# Patient Record
Sex: Female | Born: 1960 | Race: White | Hispanic: No | Marital: Married | Smoking: Current every day smoker
Health system: Southern US, Community
[De-identification: ages and names within clinical notes are randomized; demographics above are authoritative.]

## PROBLEM LIST (undated history)

## (undated) DIAGNOSIS — I2510 Atherosclerotic heart disease of native coronary artery without angina pectoris: Secondary | ICD-10-CM

## (undated) DIAGNOSIS — I10 Essential (primary) hypertension: Secondary | ICD-10-CM

## (undated) DIAGNOSIS — E785 Hyperlipidemia, unspecified: Secondary | ICD-10-CM

## (undated) DIAGNOSIS — F419 Anxiety disorder, unspecified: Secondary | ICD-10-CM

## (undated) DIAGNOSIS — M1990 Unspecified osteoarthritis, unspecified site: Secondary | ICD-10-CM

## (undated) DIAGNOSIS — R51 Headache: Secondary | ICD-10-CM

## (undated) DIAGNOSIS — F319 Bipolar disorder, unspecified: Secondary | ICD-10-CM

## (undated) DIAGNOSIS — K219 Gastro-esophageal reflux disease without esophagitis: Secondary | ICD-10-CM

## (undated) DIAGNOSIS — F329 Major depressive disorder, single episode, unspecified: Secondary | ICD-10-CM

## (undated) DIAGNOSIS — Z720 Tobacco use: Secondary | ICD-10-CM

## (undated) DIAGNOSIS — R519 Headache, unspecified: Secondary | ICD-10-CM

## (undated) DIAGNOSIS — F32A Depression, unspecified: Secondary | ICD-10-CM

## (undated) HISTORY — DX: Hyperlipidemia, unspecified: E78.5

---

## 1989-06-18 HISTORY — PX: LAPAROSCOPIC CHOLECYSTECTOMY: SUR755

## 2015-01-04 ENCOUNTER — Encounter (HOSPITAL_COMMUNITY): Payer: Self-pay | Admitting: Cardiology

## 2015-01-04 ENCOUNTER — Encounter (HOSPITAL_COMMUNITY)
Admission: EM | Disposition: A | Discharge status: Home / Self Care | Payer: 59 | Source: Home / Self Care | Attending: Cardiovascular Disease

## 2015-01-04 ENCOUNTER — Inpatient Hospital Stay (HOSPITAL_COMMUNITY)
Admission: EM | Admit: 2015-01-04 | Discharge: 2015-01-08 | DRG: 246 | Disposition: A | Discharge status: Home / Self Care | Payer: 59 | Attending: Cardiovascular Disease | Admitting: Cardiovascular Disease

## 2015-01-04 ENCOUNTER — Emergency Department (HOSPITAL_COMMUNITY)
Admission: EM | Admit: 2015-01-04 | Discharge: 2015-01-04 | Disposition: A | Discharge status: Home / Self Care | Payer: 59

## 2015-01-04 ENCOUNTER — Ambulatory Visit (HOSPITAL_COMMUNITY): Admission: EM | Admit: 2015-01-04 | Payer: Self-pay

## 2015-01-04 DIAGNOSIS — E119 Type 2 diabetes mellitus without complications: Secondary | ICD-10-CM | POA: Diagnosis present

## 2015-01-04 DIAGNOSIS — Z720 Tobacco use: Secondary | ICD-10-CM | POA: Diagnosis not present

## 2015-01-04 DIAGNOSIS — R51 Headache: Secondary | ICD-10-CM | POA: Diagnosis not present

## 2015-01-04 DIAGNOSIS — Z880 Allergy status to penicillin: Secondary | ICD-10-CM | POA: Diagnosis not present

## 2015-01-04 DIAGNOSIS — I2542 Coronary artery dissection: Secondary | ICD-10-CM | POA: Diagnosis not present

## 2015-01-04 DIAGNOSIS — E785 Hyperlipidemia, unspecified: Secondary | ICD-10-CM | POA: Diagnosis present

## 2015-01-04 DIAGNOSIS — I10 Essential (primary) hypertension: Secondary | ICD-10-CM | POA: Diagnosis present

## 2015-01-04 DIAGNOSIS — I213 ST elevation (STEMI) myocardial infarction of unspecified site: Secondary | ICD-10-CM | POA: Diagnosis not present

## 2015-01-04 DIAGNOSIS — E876 Hypokalemia: Secondary | ICD-10-CM | POA: Diagnosis present

## 2015-01-04 DIAGNOSIS — R7301 Impaired fasting glucose: Secondary | ICD-10-CM | POA: Diagnosis present

## 2015-01-04 DIAGNOSIS — F17200 Nicotine dependence, unspecified, uncomplicated: Secondary | ICD-10-CM | POA: Diagnosis present

## 2015-01-04 DIAGNOSIS — I2119 ST elevation (STEMI) myocardial infarction involving other coronary artery of inferior wall: Secondary | ICD-10-CM | POA: Diagnosis present

## 2015-01-04 DIAGNOSIS — I2111 ST elevation (STEMI) myocardial infarction involving right coronary artery: Secondary | ICD-10-CM

## 2015-01-04 DIAGNOSIS — I2510 Atherosclerotic heart disease of native coronary artery without angina pectoris: Secondary | ICD-10-CM

## 2015-01-04 HISTORY — PX: LEFT HEART CATHETERIZATION WITH CORONARY ANGIOGRAM: SHX5451

## 2015-01-04 HISTORY — DX: Essential (primary) hypertension: I10

## 2015-01-04 HISTORY — PX: PERCUTANEOUS CORONARY STENT INTERVENTION (PCI-S): SHX5485

## 2015-01-04 HISTORY — DX: Atherosclerotic heart disease of native coronary artery without angina pectoris: I25.10

## 2015-01-04 HISTORY — DX: Bipolar disorder, unspecified: F31.9

## 2015-01-04 HISTORY — DX: Headache: R51

## 2015-01-04 HISTORY — DX: Major depressive disorder, single episode, unspecified: F32.9

## 2015-01-04 HISTORY — DX: Gastro-esophageal reflux disease without esophagitis: K21.9

## 2015-01-04 HISTORY — DX: Anxiety disorder, unspecified: F41.9

## 2015-01-04 HISTORY — DX: Depression, unspecified: F32.A

## 2015-01-04 HISTORY — DX: Headache, unspecified: R51.9

## 2015-01-04 HISTORY — DX: Tobacco use: Z72.0

## 2015-01-04 LAB — MRSA PCR SCREENING: MRSA BY PCR: NEGATIVE

## 2015-01-04 LAB — LIPID PANEL
CHOLESTEROL: 236 mg/dL — AB (ref 0–200)
HDL: 35 mg/dL — AB (ref >39)
LDL CALC: 170 mg/dL — AB (ref 0–99)
Total CHOL/HDL Ratio: 6.7 RATIO
Triglycerides: 154 mg/dL — ABNORMAL HIGH (ref <150)
VLDL: 31 mg/dL (ref 0–40)

## 2015-01-04 LAB — CBC
HCT: 39.5 % (ref 36.0–46.0)
HCT: 35.7 % — ABNORMAL LOW (ref 36.0–46.0)
HEMOGLOBIN: 13.3 g/dL (ref 12.0–15.0)
HEMOGLOBIN: 11.9 g/dL — AB (ref 12.0–15.0)
MCH: 31.2 pg (ref 26.0–34.0)
MCH: 30.9 pg (ref 26.0–34.0)
MCHC: 33.7 g/dL (ref 30.0–36.0)
MCHC: 33.3 g/dL (ref 30.0–36.0)
MCV: 92.7 fL (ref 78.0–100.0)
MCV: 92.7 fL (ref 78.0–100.0)
Platelets: 368 10*3/uL (ref 150–400)
Platelets: 266 10*3/uL (ref 150–400)
RBC: 4.26 MIL/uL (ref 3.87–5.11)
RBC: 3.85 MIL/uL — AB (ref 3.87–5.11)
RDW: 13.2 % (ref 11.5–15.5)
RDW: 13.3 % (ref 11.5–15.5)
WBC: 13.9 10*3/uL — AB (ref 4.0–10.5)
WBC: 10.2 10*3/uL (ref 4.0–10.5)

## 2015-01-04 LAB — POCT ACTIVATED CLOTTING TIME: Activated Clotting Time: 128 seconds

## 2015-01-04 LAB — BASIC METABOLIC PANEL
ANION GAP: 9 (ref 5–15)
BUN: 11 mg/dL (ref 6–23)
CALCIUM: 8.5 mg/dL (ref 8.4–10.5)
CHLORIDE: 112 mmol/L (ref 96–112)
CO2: 19 mmol/L (ref 19–32)
CREATININE: 0.73 mg/dL (ref 0.50–1.10)
GFR calc Af Amer: >90 mL/min (ref >90)
Glucose, Bld: 171 mg/dL — ABNORMAL HIGH (ref 70–99)
POTASSIUM: 3.3 mmol/L — AB (ref 3.5–5.1)
SODIUM: 140 mmol/L (ref 135–145)

## 2015-01-04 LAB — TROPONIN I
Troponin I: 49.97 ng/mL (ref <0.031)
Troponin I: 59.92 ng/mL (ref <0.031)
Troponin I: 36.96 ng/mL (ref <0.031)

## 2015-01-04 SURGERY — LEFT HEART CATHETERIZATION WITH CORONARY ANGIOGRAM
Anesthesia: LOCAL

## 2015-01-04 MED ORDER — ATORVASTATIN CALCIUM 80 MG PO TABS
80.0000 mg | ORAL_TABLET | Freq: Every day | ORAL | Status: DC
  Administered 2015-01-04 – 2015-01-07 (×4): 80 mg via ORAL
  Filled 2015-01-04 (×5): qty 1

## 2015-01-04 MED ORDER — SODIUM CHLORIDE 0.9 % IV SOLN
1.0000 mg/kg/h | INTRAVENOUS | Status: DC
  Filled 2015-01-04: qty 250

## 2015-01-04 MED ORDER — MORPHINE SULFATE 2 MG/ML IJ SOLN
2.0000 mg | INTRAMUSCULAR | Status: DC | PRN
  Administered 2015-01-04 (×2): 2 mg via INTRAVENOUS
  Filled 2015-01-04 (×3): qty 1

## 2015-01-04 MED ORDER — NITROGLYCERIN 1 MG/10 ML FOR IR/CATH LAB
INTRA_ARTERIAL | Status: AC
  Filled 2015-01-04: qty 10

## 2015-01-04 MED ORDER — TIROFIBAN HCL IV 5 MG/100ML
0.1500 ug/kg/min | INTRAVENOUS | Status: AC
  Administered 2015-01-04 (×2): 0.15 ug/kg/min via INTRAVENOUS
  Filled 2015-01-04 (×4): qty 100

## 2015-01-04 MED ORDER — NITROGLYCERIN IN D5W 200-5 MCG/ML-% IV SOLN
0.0000 ug/min | INTRAVENOUS | Status: DC
  Administered 2015-01-04: 10 ug/min via INTRAVENOUS

## 2015-01-04 MED ORDER — FENTANYL CITRATE 0.05 MG/ML IJ SOLN
INTRAMUSCULAR | Status: AC
  Filled 2015-01-04: qty 2

## 2015-01-04 MED ORDER — TICAGRELOR 90 MG PO TABS
ORAL_TABLET | ORAL | Status: AC
  Filled 2015-01-04: qty 2

## 2015-01-04 MED ORDER — SODIUM CHLORIDE 0.9 % IV SOLN
INTRAVENOUS | Status: AC
  Administered 2015-01-04: 75 mL/h via INTRAVENOUS

## 2015-01-04 MED ORDER — LIDOCAINE HCL (PF) 1 % IJ SOLN
INTRAMUSCULAR | Status: AC
  Filled 2015-01-04: qty 30

## 2015-01-04 MED ORDER — BIVALIRUDIN 250 MG IV SOLR
INTRAVENOUS | Status: AC
  Filled 2015-01-04: qty 250

## 2015-01-04 MED ORDER — ASPIRIN 81 MG PO CHEW
81.0000 mg | CHEWABLE_TABLET | Freq: Every day | ORAL | Status: DC
  Administered 2015-01-05 – 2015-01-08 (×3): 81 mg via ORAL
  Filled 2015-01-04 (×4): qty 1

## 2015-01-04 MED ORDER — TIROFIBAN HCL IV 5 MG/100ML
INTRAVENOUS | Status: AC
  Filled 2015-01-04: qty 100

## 2015-01-04 MED ORDER — TICAGRELOR 90 MG PO TABS
90.0000 mg | ORAL_TABLET | Freq: Two times a day (BID) | ORAL | Status: DC
  Administered 2015-01-04 – 2015-01-08 (×8): 90 mg via ORAL
  Filled 2015-01-04 (×9): qty 1

## 2015-01-04 MED ORDER — LORAZEPAM 1 MG PO TABS
1.0000 mg | ORAL_TABLET | Freq: Three times a day (TID) | ORAL | Status: DC | PRN
  Administered 2015-01-04 – 2015-01-06 (×5): 1 mg via ORAL
  Filled 2015-01-04 (×6): qty 1

## 2015-01-04 MED ORDER — HEPARIN (PORCINE) IN NACL 2-0.9 UNIT/ML-% IJ SOLN
INTRAMUSCULAR | Status: AC
  Filled 2015-01-04: qty 1500

## 2015-01-04 MED ORDER — POTASSIUM CHLORIDE CRYS ER 20 MEQ PO TBCR
40.0000 meq | EXTENDED_RELEASE_TABLET | Freq: Once | ORAL | Status: AC
Start: 2015-01-04 — End: 2015-01-04
  Administered 2015-01-04: 40 meq via ORAL
  Filled 2015-01-04 (×2): qty 2

## 2015-01-04 MED ORDER — METOPROLOL TARTRATE 12.5 MG HALF TABLET
12.5000 mg | ORAL_TABLET | Freq: Two times a day (BID) | ORAL | Status: DC
  Administered 2015-01-04 – 2015-01-08 (×9): 12.5 mg via ORAL
  Filled 2015-01-04 (×10): qty 1

## 2015-01-04 MED ORDER — ONDANSETRON HCL 4 MG/2ML IJ SOLN: 4.0000 mg | Freq: Four times a day (QID) | INTRAMUSCULAR | Status: DC | PRN

## 2015-01-04 MED ORDER — ACETAMINOPHEN 325 MG PO TABS
650.0000 mg | ORAL_TABLET | ORAL | Status: DC | PRN
  Administered 2015-01-04 – 2015-01-08 (×7): 650 mg via ORAL
  Filled 2015-01-04 (×7): qty 2

## 2015-01-04 MED ORDER — HEPARIN (PORCINE) IN NACL 2-0.9 UNIT/ML-% IJ SOLN
INTRAMUSCULAR | Status: AC
  Filled 2015-01-04: qty 1000

## 2015-01-04 MED ORDER — ATROPINE SULFATE 0.1 MG/ML IJ SOLN
INTRAMUSCULAR | Status: AC
  Filled 2015-01-04: qty 10

## 2015-01-04 NOTE — ED Notes (Signed)
To Cath lab-- with monitor, pacer pads on,

## 2015-01-04 NOTE — CV Procedure (Addendum)
Sara Trujillo is a 54 y.o. female    [id number] LOCATION:  FACILITY: [facility]  PHYSICIAN: Quay Burow, M.D. [birth year]   DATE OF PROCEDURE:  01/04/2015  DATE OF DISCHARGE:     CARDIAC CATHETERIZATION / PCI NOTE      History obtained from chart review.Sara Trujillo is a 54 year old female appearing married Caucasian female with no children who presented to [facility] emergency room with an inferior STEMI. The pain began at 6:00 this morning. Her only risk factors are tobacco abuse. She was hemodynamic stable and was brought to the Cath Lab for urgent intervention.   PROCEDURE DESCRIPTION:   The patient was brought to the second floor  [facility] lab in the postabsorptive state. She was   premedicated with morphine IV in the emergency room.Marland Kitchen Her right groin was prepped and shaved in usual sterile fashion. Xylocaine 1% was used  for local anesthesia. A 6 French sheath was inserted into the right common femoral  artery using standard Seldinger technique. 6 French right and left Judkins diagnostic catheters along with a 6 French pigtail catheter were used for selective coronary angiography and left ventriculography respectively. The pigtail was used for the entirety of the case. Retrograde aortic, left ventricular and pullback pressures were recorded.   HEMODYNAMICS:    AO SYSTOLIC/AO DIASTOLIC: 009/23   LV SYSTOLIC/LV DIASTOLIC: 300/76  ANGIOGRAPHIC RESULTS:   1. Left main; normal  2. LAD; 60% proximal, first diagonal branch was small Caliber and moderate in length and had 50-60% stenosis in its proximal third 3. Left circumflex; nondominant with a 90% mid AV groove, there was a ramus branch that had an 80% segmental mid stenosis.  4. Right coronary artery; dominant with occlusion at the Genu . This was the infarct related artery 5. Left ventriculography; RAO left ventriculogram was performed using  25 mL of Visipaque dye at 12 mL/second. The overall LVEF  estimated  60 %  With wall motion abnormalities notable for a true apical severe hypokinesia  IMPRESSION:Sara Trujillo has an occluded RCA responsible for her inferior STEMI with high-grade disease in her circumflex and moderate disease in her ramus intermedius and LAD/diagonal branch. We'll proceed with PCI and stenting of her RCA  Procedure description: The patient received Brilenta 180 mg by mouth addition to angina bolus and without infusion. Her ACT was 417. Total contrast administered and the case was 135 mL. A 6 [place] guide catheter along with a 0.14/190 cm [place] guidewire and that 2 mm x 12 mm long balloon angioplasty was performed. There was moderate disease in the proximal right, high-grade disease it at the Genu and crux of the vessel. B segments were dilated with a balloon. Following this a 2.25 x 12 mm long science drug-eluting stent was then carefully positioned angiographically and deployed in the distal RCA just before the takeoff of the PDA as well as at the Genu. There were post dilated with 2.5 mm millimeter by 8 mm long noncompliant balloons up to 16 atm (2.6) resulting reduction total occlusion to 0% residual with TIMI-3 flow. The patient was stable at the end of the procedure however while still on the Cath Lab table she developed recurrent chest pain and EKG showed ST segment elevation in the inferior leads and therefore it was decided to repeat angiography to determine patency of the RCA.  Overall impression: Successful RCA PCI and stenting of distal RCA with 2 drug-eluting stents with residual disease in the nondominant circumflex, LAD diagonal branch and  ramus branch with preserved LV function. The door to balloon time was 27 minutes.  Lorretta Harp MD, [facility] 01/04/2015 8:25 AM    Addendum: At the end of the procedure the patient developed recurrent chest pain with recurrent ST segment elevation in the inferior leads. Based on this I decided to relook at the RCA.  I exchanged the sheath out using double glove technique. The RCA demonstrated occlusion in the proximal portion. The ACT was 325. I elected to start Aggrastat which we will continue for 18 hours. I then reengaged the RCA with a JR4 guide catheter and was able to cross the lesion with a pro-water guidewire and balloon the proximal RCA with a 2 mm x 12 mm E[place]hora  Balloon. Following this I stented the entire mid and proximal segment with a 2.5 mm x 28 mm long Xience drug-eluting stent at 15 atm and postdilated with a 2.75 mm x 20 mm long [place] Euphora balloon at 16 atm (2.8 mm)  resulting reduction of a total occlusion to 0% residual with TIMI-3 flow. At the end of the procedure the patient's pain markedly improved. The plan will be to continue full dose Angiomax for 4 hours and Aggrastat for 18 hours.   Lorretta Harp, M.D., [place], [facility], [place] [address] [place], [place]  [address]  [phone] 01/04/2015 9:12 AM

## 2015-01-04 NOTE — Progress Notes (Signed)
Elevated troponin level called to PA B.S.

## 2015-01-04 NOTE — ED Notes (Signed)
Pt undressed and in a gown on the monitor and on the zole

## 2015-01-04 NOTE — Progress Notes (Signed)
Chaplain responded to Code STEMI.  Husband indicates that patient had been taking care of him (he has had "uncurable colon cancer" for the last two years; trying to get into a clinical trial).  Twin sister of patient and brother in law had just arrived.  All seem anxious, especially twin sister.  Provided drinks, emotional support and orientation to area and standards of procedure.  Please call if further support is needed.  Debby Budalacios, Pricilla Moehle DolandN, IowaChaplain [phone]

## 2015-01-04 NOTE — H&P (Signed)
Patient ID: Alianys Chacko MRN: [id number], DOB/AGE: 1961-05-01   Admit date: 01/04/2015   Primary Physician: No primary care provider on file. Primary Cardiologist: Dr. Allyson Sabal ([place])  Pt. Profile:  54 y/o female smoker with no prior cardiac history presenting w/ inferior STEMI  Problem List  Past Medical History  Diagnosis Date  . CAD (coronary artery disease) 01/04/15    Inferior STEMI  . Tobacco abuse     No past surgical history on file.   Allergies  Allergies  Allergen Reactions  . Penicillins     HPI The patient is a 54 y/o female, with a history of tobacco abuse but no other PMH presenting with inferior STEMI. She is not followed routinely by a PCP. She denies any prior cardiac history.  She is currently on no medications. She has a penicillin allergy.   Around 6 AM today, she developed severe SSCP and malaise. No reported dyspnea, nausea or vomiting. EMS was called. EKG in the field showed inferior ST segment elevataions. She was transported urgently to [facility] cath lab for urgent coronary intervention. Enroute, she was given 325 mg of ASA. On arrival, she was with ongoing 7/10 chest pain.    Home Medications  Prior to Admission medications   Not on File    Family History  Family History  Problem Relation Age of Onset  . Family history unknown: Yes    Social History  History   Social History  . Marital Status: Married    Spouse Name: N/A  . Number of Children: N/A  . Years of Education: N/A   Occupational History  . Not on file.   Social History Main Topics  . Smoking status: Current Every Day Smoker  . Smokeless tobacco: Not on file  . Alcohol Use: Not on file  . Drug Use: Not on file  . Sexual Activity: Not on file   Other Topics Concern  . Not on file   Social History Narrative  . No narrative on file     Review of Systems General:  No chills, fever, night sweats or weight changes.  Cardiovascular:  + for chest pain, no dyspnea  on exertion, edema, orthopnea, palpitations, paroxysmal nocturnal dyspnea. Dermatological: No rash, lesions/masses Respiratory: No cough, dyspnea Urologic: No hematuria, dysuria Abdominal:   No nausea, vomiting, diarrhea, bright red blood per rectum, melena, or hematemesis Neurologic:  No visual changes, wkns, changes in mental status. All other systems reviewed and are otherwise negative except as noted above.  Physical Exam  There were no vitals taken for this visit.  General: Pleasant, NAD Psych: Normal affect. Neuro: Alert and oriented X 3. Moves all extremities spontaneously. HEENT: Normal  Neck: Supple without bruits or JVD. Lungs:  Resp regular and unlabored, CTA. Heart: RRR no s3, s4, or murmurs. Abdomen: Soft, non-tender, non-distended, BS + x 4.  Extremities: No clubbing, cyanosis or edema. DP/PT/Radials 2+ and equal bilaterally.  Labs  Troponin (Point of Care Test) No results for input(s): TROPIPOC in the last 72 hours. No results for input(s): CKTOTAL, CKMB, TROPONINI in the last 72 hours. No results found for: WBC, HGB, HCT, MCV, PLT No results for input(s): NA, K, CL, CO2, BUN, CREATININE, CALCIUM, PROT, BILITOT, ALKPHOS, ALT, AST, GLUCOSE in the last 168 hours.  Invalid input(s): LABALBU No results found for: CHOL, HDL, LDLCALC, TRIG No results found for: DDIMER   Radiology/Studies  No results found.  ECG  Inferior ST elevations. HR 66 bpm   ASSESSMENT AND  PLAN  Active Problems:   Inferior STEMI (ST elevation myocardial infarction)   Tobacco abuse   ST elevation myocardial infarction (STEMI) of inferior wall   1. Inferior STEMI: secondary to occlusion of the distal RCA, successfully treated with PCI utilizing  DES x 2. Also with significant LCx disease. Will likely need PCI to circumflex prior to discharge. LVF normal. Treat with DAPT, high dose statin therapy, and BB and ACE-I as HR and BP allows.   2. Secondary Prevention:   - ASA, BB, ACE + Statin.    - Risk factor modification: encourage smoking cessation.   - Risk factor screening: check Lipid panel (goal LDL <70 mg/dL) and Hgb Y7W,GNFAOZHA1c,monitor BP  - Cardiac Rehab   Signed, Robbie LisSIMMONS, Mayci Haning, PA-C 01/04/2015, 8:10 AM

## 2015-01-04 NOTE — Progress Notes (Signed)
CRITICAL VALUE ALERT  Critical value received:  troponin  Date of notification:  01/04/2015  Time of notification:  2205  Critical value read back:yes  Nurse who received alert: Shary DecampLatroya Ellanore Vanhook Rn  MD notified (1st page):  Elink made aware  Time of first page:  2207  MD notified (2nd page):  Time of second page:  Responding MD:  Pola CornELink  Time MD responded:  2207

## 2015-01-04 NOTE — Progress Notes (Signed)
Patient still has right femoral sheath intact, no ambulation done.  Given MI education booklet, explained process of MI and risk factors.  She is a smoker, has been under stress (her husband has colon cancer), and she is sedentary.  Will continue to work with patient when sheath is removed and continue education.   [id number]

## 2015-01-04 NOTE — Progress Notes (Signed)
Right femoral 6 fr sheath pulled, manual  pressure held for , small area of bruising around site. Positive distal pulses +2, + cap refil, pt tolerated well.

## 2015-01-04 NOTE — Progress Notes (Signed)
Ambulated pt. Vitals signs remained WDL. With exception of HR. Went tachy for about 5 seconds. No c/o chest pain. No site complications.

## 2015-01-04 NOTE — Progress Notes (Signed)
Pt c/o chest pressure,  Not like pain from stemi. PA aware, 12 lead done, ntg gtt started, morphine given.

## 2015-01-04 NOTE — ED Notes (Signed)
To ED via [facility] with STEMI-- to go to cath lab-- dr berry at bedside on arrival, pt having pain 5/10-- midsternal, pressure. Received NTG 3 sl, ASA 324mg , Morphine 4 mg IV enroute.

## 2015-01-05 DIAGNOSIS — I10 Essential (primary) hypertension: Secondary | ICD-10-CM | POA: Diagnosis present

## 2015-01-05 DIAGNOSIS — Z720 Tobacco use: Secondary | ICD-10-CM

## 2015-01-05 DIAGNOSIS — E119 Type 2 diabetes mellitus without complications: Secondary | ICD-10-CM

## 2015-01-05 DIAGNOSIS — E785 Hyperlipidemia, unspecified: Secondary | ICD-10-CM

## 2015-01-05 DIAGNOSIS — I2119 ST elevation (STEMI) myocardial infarction involving other coronary artery of inferior wall: Secondary | ICD-10-CM

## 2015-01-05 LAB — BASIC METABOLIC PANEL
ANION GAP: 4 — AB (ref 5–15)
Anion gap: 6 (ref 5–15)
BUN: 8 mg/dL (ref 6–23)
BUN: 9 mg/dL (ref 6–23)
CALCIUM: 8.5 mg/dL (ref 8.4–10.5)
CALCIUM: 8.8 mg/dL (ref 8.4–10.5)
CHLORIDE: 109 mmol/L (ref 96–112)
CO2: 23 mmol/L (ref 19–32)
CO2: 27 mmol/L (ref 19–32)
CREATININE: 0.9 mg/dL (ref 0.50–1.10)
Chloride: 108 mmol/L (ref 96–112)
Creatinine, Ser: 0.61 mg/dL (ref 0.50–1.10)
GFR calc Af Amer: 83 mL/min — ABNORMAL LOW (ref >90)
GFR calc non Af Amer: >90 mL/min (ref >90)
GFR calc non Af Amer: 71 mL/min — ABNORMAL LOW (ref >90)
Glucose, Bld: 107 mg/dL — ABNORMAL HIGH (ref 70–99)
Glucose, Bld: 88 mg/dL (ref 70–99)
POTASSIUM: 3.6 mmol/L (ref 3.5–5.1)
POTASSIUM: 3.7 mmol/L (ref 3.5–5.1)
SODIUM: 139 mmol/L (ref 135–145)
Sodium: 138 mmol/L (ref 135–145)

## 2015-01-05 LAB — CBC
HCT: 36.5 % (ref 36.0–46.0)
HEMATOCRIT: 35.5 % — AB (ref 36.0–46.0)
HEMOGLOBIN: 11.5 g/dL — AB (ref 12.0–15.0)
HEMOGLOBIN: 12 g/dL (ref 12.0–15.0)
MCH: 30.2 pg (ref 26.0–34.0)
MCH: 30.7 pg (ref 26.0–34.0)
MCHC: 32.4 g/dL (ref 30.0–36.0)
MCHC: 32.9 g/dL (ref 30.0–36.0)
MCV: 93.2 fL (ref 78.0–100.0)
MCV: 93.4 fL (ref 78.0–100.0)
Platelets: 259 10*3/uL (ref 150–400)
Platelets: 236 10*3/uL (ref 150–400)
RBC: 3.81 MIL/uL — AB (ref 3.87–5.11)
RBC: 3.91 MIL/uL (ref 3.87–5.11)
RDW: 13.5 % (ref 11.5–15.5)
RDW: 13.3 % (ref 11.5–15.5)
WBC: 9.8 10*3/uL (ref 4.0–10.5)
WBC: 8.3 10*3/uL (ref 4.0–10.5)

## 2015-01-05 LAB — PROTIME-INR
INR: 1.04 (ref 0.00–1.49)
PROTHROMBIN TIME: 13.7 s (ref 11.6–15.2)

## 2015-01-05 MED ORDER — ASPIRIN 81 MG PO CHEW
81.0000 mg | CHEWABLE_TABLET | ORAL | Status: AC
  Administered 2015-01-06: 81 mg via ORAL

## 2015-01-05 MED ORDER — SODIUM CHLORIDE 0.9 % IJ SOLN: 3.0000 mL | INTRAMUSCULAR | Status: DC | PRN

## 2015-01-05 MED ORDER — SODIUM CHLORIDE 0.9 % IJ SOLN
3.0000 mL | INTRAMUSCULAR | Status: DC | PRN
Start: 2015-01-05 — End: 2015-01-06

## 2015-01-05 MED ORDER — SODIUM CHLORIDE 0.9 % IJ SOLN
3.0000 mL | Freq: Two times a day (BID) | INTRAMUSCULAR | Status: DC
  Administered 2015-01-05: 3 mL via INTRAVENOUS

## 2015-01-05 MED ORDER — SODIUM CHLORIDE 0.9 % IV SOLN: 250.0000 mL | INTRAVENOUS | Status: DC | PRN

## 2015-01-05 MED ORDER — SODIUM CHLORIDE 0.9 % IV SOLN
INTRAVENOUS | Status: DC
  Administered 2015-01-06: 06:00:00 via INTRAVENOUS

## 2015-01-05 MED ORDER — SODIUM CHLORIDE 0.9 % IJ SOLN
3.0000 mL | Freq: Two times a day (BID) | INTRAMUSCULAR | Status: DC
  Administered 2015-01-05 – 2015-01-06 (×2): 3 mL via INTRAVENOUS

## 2015-01-05 MED ORDER — LISINOPRIL 2.5 MG PO TABS
2.5000 mg | ORAL_TABLET | Freq: Every day | ORAL | Status: DC
  Administered 2015-01-06: 2.5 mg via ORAL
  Filled 2015-01-05 (×3): qty 1

## 2015-01-05 NOTE — Progress Notes (Addendum)
Patient Name: Britta MccreedyMelinda Trainer Date of Encounter: 01/05/2015  Active Problems:   Inferior STEMI (ST elevation myocardial infarction)   Tobacco abuse   ST elevation myocardial infarction (STEMI) of inferior wall   Hyperlipidemia   Essential hypertension   Length of Stay: 1  SUBJECTIVE  The patient had mild residual chest pain overnight, still on NTG drip, she just has mild chest pressure and headache right now.   CURRENT MEDS . aspirin  81 mg Oral Daily  . atorvastatin  80 mg Oral q1800  . metoprolol tartrate  12.5 mg Oral BID  . ticagrelor  90 mg Oral BID    OBJECTIVE  Filed Vitals:   01/05/15 0500 01/05/15 0600 01/05/15 0700 01/05/15 0800  BP: 126/76 121/69 123/76 121/77  Pulse: 70 72 69 79  Temp:    98.7 F (37.1 C)  TempSrc:    Oral  Resp: 16 12 14 21   Height:      Weight:      SpO2: 97% 97% 96% 97%    Intake/Output Summary (Last 24 hours) at 01/05/15 0933 Last data filed at 01/05/15 0800  Gross per 24 hour  Intake 1553.5 ml  Output   3125 ml  Net -1571.5 ml   Filed Weights   01/04/15 1000  Weight: 169 lb 12.1 oz (77 kg)    PHYSICAL EXAM  General: Pleasant, NAD. Neuro: Alert and oriented X 3. Moves all extremities spontaneously. Psych: Normal affect. HEENT:  Normal  Neck: Supple without bruits or JVD. Lungs:  Resp regular and unlabored, CTA. Heart: RRR no s3, s4, or murmurs. Abdomen: Soft, non-tender, non-distended, BS + x 4.  Extremities: No clubbing, cyanosis or edema. DP/PT/Radials 2+ and equal bilaterally.  Accessory Clinical Findings  CBC  Recent Labs  01/04/15 1932 01/05/15 0343  WBC 10.2 9.8  HGB 11.9* 11.5*  HCT 35.7* 35.5*  MCV 92.7 93.2  PLT 266 259   Basic Metabolic Panel  Recent Labs  01/04/15 0720 01/05/15 0343  NA 140 138  K 3.3* 3.6  CL 112 109  CO2 19 23  GLUCOSE 171* 107*  BUN 11 8  CREATININE 0.73 0.61  CALCIUM 8.5 8.5   Cardiac Enzymes  Recent Labs  01/04/15 1230 01/04/15 1615 01/04/15 2105    TROPONINI 49.97* 59.92* 36.96*    Recent Labs  01/04/15 0720  CHOL 236*  HDL 35*  LDLCALC 170*  TRIG 154*  CHOLHDL 6.7   Radiology/Studies  No results found.  TELE: SR, PVCs  ECG: SR, residual minimal ST elevations in the inferior leads, Q wave in inferior leads  Left cardiac cath: 01/04/2015 ANGIOGRAPHIC RESULTS:   1. Left main; normal  2. LAD; 60% proximal, first diagonal branch was small Caliber and moderate in length and had 50-60% stenosis in its proximal third 3. Left circumflex; nondominant with a 90% mid AV groove, there was a ramus branch that had an 80% segmental mid stenosis.  4. Right coronary artery; dominant with occlusion at the Genu . This was the infarct related artery 5. Left ventriculography; RAO left ventriculogram was performed using  25 mL of Visipaque dye at 12 mL/second. The overall LVEF estimated  60 % With wall motion abnormalities notable for a true apical severe hypokinesia  IMPRESSION:Mrs. Messing has an occluded RCA responsible for her inferior STEMI with high-grade disease in her circumflex and moderate disease in her ramus intermedius and LAD/diagonal branch. We'll proceed with PCI and stenting of her RCA    ASSESSMENT AND PLAN  54 year old female  1. CAD, STEMI on 01/04/2015, s/p successful PCI/ 2 DES of the occluded dominant RCA, the vessel reoccluded at the end of the procedure, Aggrastat was started and the lesion was ballooned. Aggrastat 18 hour infusion just finished. There is residual disease in the nondominant circumflex, LAD diagonal branch and ramus branch with preserved LV function, LVEF 60% on ventriculogram, this am ECG shows residual minimal ST elevations in the inferior leads, Q wave in inferior leads - PCI to LCX tomorrow - DAPT for at least 1 year - high dose of atorvastatin 80 mg po daily - metoprolol 12.5 mg po daily - we will on hold on starting lisinopril 2.5 mg po daily as she has another intervention  tomorrow - echo is pending  2. Hyperlipidemia - started on high dose atorvastatin 80 mg po daily, will need CMP in 1 month  3. New dg of DM - check HbA1c - start on metformin at the first post-hospital visit  4. Smoking cessation - counseling provided   Signed, Lars Masson MD, [facility] 01/05/2015

## 2015-01-06 ENCOUNTER — Encounter (HOSPITAL_COMMUNITY): Payer: Self-pay | Admitting: Cardiovascular Disease

## 2015-01-06 ENCOUNTER — Encounter (HOSPITAL_COMMUNITY)
Admission: EM | Disposition: A | Discharge status: Home / Self Care | Payer: 59 | Source: Home / Self Care | Attending: Cardiovascular Disease

## 2015-01-06 DIAGNOSIS — I213 ST elevation (STEMI) myocardial infarction of unspecified site: Secondary | ICD-10-CM

## 2015-01-06 DIAGNOSIS — I2510 Atherosclerotic heart disease of native coronary artery without angina pectoris: Secondary | ICD-10-CM

## 2015-01-06 HISTORY — PX: PERCUTANEOUS CORONARY STENT INTERVENTION (PCI-S): SHX5485

## 2015-01-06 LAB — BASIC METABOLIC PANEL
Anion gap: 8 (ref 5–15)
BUN: 8 mg/dL (ref 6–23)
CO2: 22 mmol/L (ref 19–32)
Calcium: 8.8 mg/dL (ref 8.4–10.5)
Chloride: 110 mmol/L (ref 96–112)
Creatinine, Ser: 0.69 mg/dL (ref 0.50–1.10)
GFR calc Af Amer: >90 mL/min (ref >90)
Glucose, Bld: 97 mg/dL (ref 70–99)
POTASSIUM: 3.3 mmol/L — AB (ref 3.5–5.1)
Sodium: 140 mmol/L (ref 135–145)

## 2015-01-06 LAB — POCT ACTIVATED CLOTTING TIME
Activated Clotting Time: 417 seconds
Activated Clotting Time: 325 seconds
Activated Clotting Time: 423 seconds

## 2015-01-06 LAB — POCT I-STAT, CHEM 8
BUN: 11 mg/dL (ref 6–23)
CALCIUM ION: 1.17 mmol/L (ref 1.12–1.23)
CHLORIDE: 109 mmol/L (ref 96–112)
Creatinine, Ser: 0.5 mg/dL (ref 0.50–1.10)
GLUCOSE: 177 mg/dL — AB (ref 70–99)
HCT: 38 % (ref 36.0–46.0)
Hemoglobin: 12.9 g/dL (ref 12.0–15.0)
POTASSIUM: 3 mmol/L — AB (ref 3.5–5.1)
Sodium: 139 mmol/L (ref 135–145)
TCO2: 16 mmol/L (ref 0–100)

## 2015-01-06 LAB — HEMOGLOBIN A1C
Hgb A1c MFr Bld: 5.8 % — ABNORMAL HIGH (ref 4.8–5.6)
Hgb A1c MFr Bld: 5.6 % (ref 4.8–5.6)
Mean Plasma Glucose: 120 mg/dL
Mean Plasma Glucose: 114 mg/dL

## 2015-01-06 LAB — HEPARIN LEVEL (UNFRACTIONATED)

## 2015-01-06 LAB — TROPONIN I
Troponin I: 7.77 ng/mL (ref <0.031)
Troponin I: 8.02 ng/mL (ref <0.031)
Troponin I: 8.52 ng/mL (ref <0.031)

## 2015-01-06 SURGERY — PERCUTANEOUS CORONARY STENT INTERVENTION (PCI-S)
Anesthesia: LOCAL

## 2015-01-06 MED ORDER — SODIUM CHLORIDE 0.9 % IV SOLN
0.2500 mg/kg/h | INTRAVENOUS | Status: DC
  Filled 2015-01-06: qty 250

## 2015-01-06 MED ORDER — SODIUM CHLORIDE 0.9 % IV SOLN
INTRAVENOUS | Status: AC
  Administered 2015-01-06: 09:00:00 via INTRAVENOUS

## 2015-01-06 MED ORDER — TICAGRELOR 90 MG PO TABS: 90.0000 mg | ORAL_TABLET | Freq: Two times a day (BID) | ORAL | Status: DC

## 2015-01-06 MED ORDER — MORPHINE SULFATE 2 MG/ML IJ SOLN: 2.0000 mg | INTRAMUSCULAR | Status: DC | PRN

## 2015-01-06 MED ORDER — POTASSIUM CHLORIDE CRYS ER 20 MEQ PO TBCR
40.0000 meq | EXTENDED_RELEASE_TABLET | Freq: Once | ORAL | Status: AC
  Administered 2015-01-06: 40 meq via ORAL
  Filled 2015-01-06: qty 2

## 2015-01-06 MED ORDER — FENTANYL CITRATE 0.05 MG/ML IJ SOLN
INTRAMUSCULAR | Status: AC
  Filled 2015-01-06: qty 2

## 2015-01-06 MED ORDER — NITROGLYCERIN IN D5W 200-5 MCG/ML-% IV SOLN
INTRAVENOUS | Status: AC
  Filled 2015-01-06: qty 250

## 2015-01-06 MED ORDER — POTASSIUM CHLORIDE CRYS ER 20 MEQ PO TBCR: 40.0000 meq | EXTENDED_RELEASE_TABLET | Freq: Once | ORAL | Status: DC

## 2015-01-06 MED ORDER — MORPHINE SULFATE 2 MG/ML IJ SOLN
2.0000 mg | INTRAMUSCULAR | Status: DC | PRN
  Administered 2015-01-06 (×5): 2 mg via INTRAVENOUS
  Filled 2015-01-06 (×4): qty 1

## 2015-01-06 MED ORDER — ASPIRIN 81 MG PO CHEW: 81.0000 mg | CHEWABLE_TABLET | Freq: Every day | ORAL | Status: DC

## 2015-01-06 MED ORDER — HEPARIN SODIUM (PORCINE) 1000 UNIT/ML IJ SOLN
INTRAMUSCULAR | Status: AC
Start: 2015-01-06 — End: 2015-01-06
  Filled 2015-01-06: qty 1

## 2015-01-06 MED ORDER — ASPIRIN 81 MG PO CHEW: 81.0000 mg | CHEWABLE_TABLET | ORAL | Status: DC

## 2015-01-06 MED ORDER — MIDAZOLAM HCL 2 MG/2ML IJ SOLN
INTRAMUSCULAR | Status: AC
  Filled 2015-01-06: qty 2

## 2015-01-06 MED ORDER — LIDOCAINE HCL (PF) 1 % IJ SOLN
INTRAMUSCULAR | Status: AC
Start: 2015-01-06 — End: 2015-01-06
  Filled 2015-01-06: qty 30

## 2015-01-06 MED ORDER — ACETAMINOPHEN 325 MG PO TABS: 650.0000 mg | ORAL_TABLET | ORAL | Status: DC | PRN

## 2015-01-06 MED ORDER — HEPARIN (PORCINE) IN NACL 100-0.45 UNIT/ML-% IJ SOLN
1150.0000 [IU]/h | INTRAMUSCULAR | Status: DC
  Administered 2015-01-06: 1000 [IU]/h via INTRAVENOUS
  Filled 2015-01-06 (×3): qty 250

## 2015-01-06 MED ORDER — BIVALIRUDIN 250 MG IV SOLR
INTRAVENOUS | Status: AC
  Filled 2015-01-06: qty 250

## 2015-01-06 MED ORDER — LORAZEPAM 1 MG PO TABS
1.0000 mg | ORAL_TABLET | Freq: Three times a day (TID) | ORAL | Status: DC
  Administered 2015-01-06 – 2015-01-07 (×4): 1 mg via ORAL
  Filled 2015-01-06 (×4): qty 1

## 2015-01-06 MED ORDER — ONDANSETRON HCL 4 MG/2ML IJ SOLN: 4.0000 mg | Freq: Four times a day (QID) | INTRAMUSCULAR | Status: DC | PRN

## 2015-01-06 MED ORDER — VERAPAMIL HCL 2.5 MG/ML IV SOLN
INTRAVENOUS | Status: AC
  Filled 2015-01-06: qty 2

## 2015-01-06 MED ORDER — MORPHINE SULFATE 2 MG/ML IJ SOLN
2.0000 mg | INTRAMUSCULAR | Status: DC | PRN
  Administered 2015-01-06: 4 mg via INTRAVENOUS
  Filled 2015-01-06: qty 2

## 2015-01-06 MED ORDER — NITROGLYCERIN IN D5W 200-5 MCG/ML-% IV SOLN
0.0000 ug/min | INTRAVENOUS | Status: DC
  Administered 2015-01-06: 25 ug/min via INTRAVENOUS

## 2015-01-06 MED ORDER — HEPARIN (PORCINE) IN NACL 2-0.9 UNIT/ML-% IJ SOLN
INTRAMUSCULAR | Status: AC
  Filled 2015-01-06: qty 1000

## 2015-01-06 MED ORDER — NITROGLYCERIN 1 MG/10 ML FOR IR/CATH LAB
INTRA_ARTERIAL | Status: AC
Start: 2015-01-06 — End: 2015-01-06
  Filled 2015-01-06: qty 10

## 2015-01-06 MED FILL — Sodium Chloride IV Soln 0.9%: INTRAVENOUS | Qty: 50 | Status: AC

## 2015-01-06 NOTE — Progress Notes (Signed)
[name] returned from the cardiac cath lab. I spoke with Dr. Allyson SabalBerry - unfortunately, her cath was complicated by dissection of the circumflex - Dr. Allyson SabalBerry was unable to re-wire the coronary. She is having considerable angina.  Nitroglycerin started and we will aggressively titrate, given her systolic BP >200. Add morphine - IV fluids for contrast load. Will need to remain in CCU for close monitoring.  Check 2D echo tomorrow for LV function.  Chrystie NoseKenneth C. Nieves Barberi, MD, [facility] Attending Cardiologist [facility]

## 2015-01-06 NOTE — Progress Notes (Signed)
ANTICOAGULATION CONSULT NOTE - Initial Consult  Pharmacy Consult for heparin Indication: chest pain/ACS  Allergies  Allergen Reactions  . Penicillins     Patient Measurements: Height: 5\' 7"  (170.2 cm) Weight: 162 lb 11.2 oz (73.8 kg) IBW/kg (Calculated) : 61.6 Heparin Dosing Weight: 70kg  Vital Signs: Temp: 98 F (36.7 C) (03/21 0500) Temp Source: Oral (03/21 0500) BP: 171/102 mmHg (03/21 0930) Pulse Rate: 78 (03/21 0930)  Labs:  Recent Labs  01/04/15 1230 01/04/15 1615 01/04/15 1932 01/04/15 2105 01/05/15 0343 01/05/15 2107 01/06/15 0321  HGB  --   --  11.9*  --  11.5* 12.0  --   HCT  --   --  35.7*  --  35.5* 36.5  --   PLT  --   --  266  --  259 236  --   LABPROT  --   --   --   --   --  13.7  --   INR  --   --   --   --   --  1.04  --   CREATININE  --   --   --   --  0.61 0.90 0.69  TROPONINI 49.97* 59.92*  --  36.96*  --   --   --     Estimated Creatinine Clearance: 78.2 mL/min (by C-G formula based on Cr of 0.69).   Medical History: Past Medical History  Diagnosis Date  . CAD (coronary artery disease) 01/04/15    Inferior STEMI  . Tobacco abuse    Assessment: 54 year old female now s/p cath complicated with dissection of the circumflex, Dr.Berry unable to re-wire the coronary. Heparin ordered to restart after TR band removed (1300). No overt bleeding/hematoma noted post cath.   Goal of Therapy:  Heparin level 0.3-0.7 units/ml Monitor platelets by anticoagulation protocol: Yes   Plan:  No bolus as she is post cath Start IV heparin at 1000 units/hr ~1600 Check HL in 6 hours  Daily CBC/HL  Sheppard CoilFrank Onix Jumper PharmD., BCPS Clinical Pharmacist Pager [phone] 01/06/2015 1:42 PM

## 2015-01-06 NOTE — Care Management Note (Addendum)
    Page 1 of 1   01/08/2015     11:54:33 AM CARE MANAGEMENT NOTE 01/08/2015  Patient:  Sara Trujillo,Sara   Account Number:  [id number]  Date Initiated:  01/06/2015  Documentation initiated by:  Junius CreamerWELL,DEBBIE  Subjective/Objective Assessment:   adm w mi     Action/Plan:   lives w husband   Anticipated DC Date:     Anticipated DC Plan:  HOME/SELF CARE      DC Planning Services  CM consult  Medication Assistance      Choice offered to / List presented to:             Status of service:   Medicare Important Message given?  YES (If response is "NO", the following Medicare IM given date fields will be blank) Date Medicare IM given:  01/08/2015 Medicare IM given by:  GRAVES-BIGELOW,Zalma Channing Date Additional Medicare IM given:   Additional Medicare IM given by:    Discharge Disposition:  HOME/SELF CARE  Per UR Regulation:  Reviewed for med. necessity/level of care/duration of stay  If discussed at Long Length of Stay Meetings, dates discussed:    Comments:  3/21 1010 debbie dowell rn,bsn spoke w pt and gave her 30day free and copay assist card for brilinta. pt has united healthcare ins.

## 2015-01-06 NOTE — Interval H&P Note (Signed)
History and Physical Interval Note:  01/06/2015 7:25 AM  Sara Trujillo  has presented today for surgery, with the diagnosis of CAD  The various methods of treatment have been discussed with the patient and family. After consideration of risks, benefits and other options for treatment, the patient has consented to  Procedure(s): PERCUTANEOUS CORONARY STENT INTERVENTION (PCI-S) (N/A) as a surgical intervention .  The patient's history has been reviewed, patient examined, no change in status, stable for surgery.  I have reviewed the patient's chart and labs.  Questions were answered to the patient's satisfaction.     Runell GessBERRY,Vallen Calabrese J

## 2015-01-06 NOTE — CV Procedure (Signed)
Britta MccreedyMelinda Tineo is a 54 y.o. female    [id number] LOCATION:  FACILITY: [facility]  PHYSICIAN: Nanetta BattyJonathan Rexton Greulich, M.D. Feb 25, 1961   DATE OF PROCEDURE:  01/06/2015  DATE OF DISCHARGE:     CARDIAC CATHETERIZATION / PCI report    History obtained from chart review.[name] is a 54 year old female who presented early Saturday morning, 319, with an inferior ST segment elevation myocardial infarction. She had successful though complex PCI and stenting of the distal and proximal RCA. She had normal LV function. She did have moderate proximal LAD disease and high-grade mid AV groove circumflex disease. She did well post MI presents today for elective percutaneous intervention of her high-grade mid nondominant circumflex prior to anticipated discharge.Marland Kitchen.   PROCEDURE DESCRIPTION:   The patient was brought to the second floor [facility] cath lab in the postabsorptive state. She once premedicated with IV Versed and fentanyl.. Her right wristwas prepped and shaved in usual sterile fashion. Xylocaine 1% was used for local anesthesia. A 5/6 French sheath was inserted into the right radial artery using standard Seldinger technique. Radial cocktail was I administered through the SideArm sheath. A 5 [place] JR4 diagnostic catheter was used to image the previously intervened on RCA. Visipaque allergies for the entirety of the case. Retrograde aortic pressure was monitored during the case.    HEMODYNAMICS:    AO SYSTOLIC/AO DIASTOLIC: 133/81     ANGIOGRAPHIC RESULTS:   1. RCA: The RCA was widely patent.  2: LAD-60% smooth proximal LAD  3: Left circumflex-95-99% focal mid AV groove circumflex with some mild post stenotic dilatation    IMPRESSION:Ms. Justin has a widely patent RCA intervened on 2 days ago with high-grade mid AV groove circumflex disease. We'll proceed with PCI and stenting of the mid nondominant AV groove circumflex.  Procedure description: The patient received  angina exposed with an ACT of 423. Total contrast administered to the patient was 160 mL. I initially image the RCA which I demonstrated to be widely patent. I then used a 6 [place] XB 3 cm curved guide catheter along with an 014/190 cm long pro-water guidewire. The patient was already on aspirin and Brilenta. The guidewire initially would not cross the lesion and therefore I used a 20/12 mm long balloon for "support". This crossed with some difficulty and ultimately went subintimal. I then switched out to an L1 for [facility] XT wire and attempt to recross and regain access into the true lumen and successfully. I reviewed the films with Dr. [place]az[place] and we both agree that there are no percutaneous options at this time. Patient will not go to [facility] bypass grafting for a circumflex infarct. Plans will be for medical therapy with nitrates, analgesics and heparin.  Final impression: Widely patent RCA with unsuccessful attempt at crossing the mid high-grade nondominant AV groove circumflex lesion, located by distal dissection unable to regain true lumen. The patient circumflex infarct will be treated medically with IV nitroglycerin, analgesics and heparin.  Runell GessBERRY,Mehtab Dolberry J. MD, [facility] 01/06/2015 8:37 AM

## 2015-01-06 NOTE — Progress Notes (Signed)
Echocardiogram 2D Echocardiogram has been performed.  Zahmir Lalla 01/06/2015, 1:33 PM

## 2015-01-07 ENCOUNTER — Encounter (HOSPITAL_COMMUNITY): Payer: Self-pay | Admitting: General Practice

## 2015-01-07 DIAGNOSIS — R7301 Impaired fasting glucose: Secondary | ICD-10-CM | POA: Diagnosis present

## 2015-01-07 DIAGNOSIS — E876 Hypokalemia: Secondary | ICD-10-CM | POA: Diagnosis present

## 2015-01-07 LAB — CBC
HCT: 37.8 % (ref 36.0–46.0)
Hemoglobin: 12.7 g/dL (ref 12.0–15.0)
MCH: 30.8 pg (ref 26.0–34.0)
MCHC: 33.6 g/dL (ref 30.0–36.0)
MCV: 91.7 fL (ref 78.0–100.0)
Platelets: 242 10*3/uL (ref 150–400)
RBC: 4.12 MIL/uL (ref 3.87–5.11)
RDW: 13 % (ref 11.5–15.5)
WBC: 9.9 10*3/uL (ref 4.0–10.5)

## 2015-01-07 LAB — BASIC METABOLIC PANEL
Anion gap: 9 (ref 5–15)
BUN: 8 mg/dL (ref 6–23)
CO2: 22 mmol/L (ref 19–32)
CREATININE: 0.82 mg/dL (ref 0.50–1.10)
Calcium: 8.9 mg/dL (ref 8.4–10.5)
Chloride: 106 mmol/L (ref 96–112)
GFR calc Af Amer: >90 mL/min (ref >90)
GFR calc non Af Amer: 80 mL/min — ABNORMAL LOW (ref >90)
GLUCOSE: 101 mg/dL — AB (ref 70–99)
POTASSIUM: 4.8 mmol/L (ref 3.5–5.1)
SODIUM: 137 mmol/L (ref 135–145)

## 2015-01-07 LAB — HEPARIN LEVEL (UNFRACTIONATED)
Heparin Unfractionated: 0.13 IU/mL — ABNORMAL LOW (ref 0.30–0.70)
Heparin Unfractionated: 0.3 IU/mL (ref 0.30–0.70)

## 2015-01-07 MED ORDER — ISOSORBIDE MONONITRATE ER 30 MG PO TB24
15.0000 mg | ORAL_TABLET | Freq: Every day | ORAL | Status: DC
  Administered 2015-01-07 – 2015-01-08 (×2): 15 mg via ORAL
  Filled 2015-01-07 (×2): qty 1

## 2015-01-07 MED ORDER — LISINOPRIL 5 MG PO TABS
5.0000 mg | ORAL_TABLET | Freq: Every day | ORAL | Status: DC
Start: 2015-01-07 — End: 2015-01-08
  Administered 2015-01-07 – 2015-01-08 (×2): 5 mg via ORAL
  Filled 2015-01-07 (×2): qty 1

## 2015-01-07 MED FILL — Sodium Chloride IV Soln 0.9%: INTRAVENOUS | Qty: 50 | Status: AC

## 2015-01-07 NOTE — Progress Notes (Signed)
ANTICOAGULATION CONSULT NOTE   Pharmacy Consult for heparin Indication: chest pain/ACS  Allergies  Allergen Reactions  . Penicillins     Patient Measurements: Height: 5\' 7"  (170.2 cm) Weight: 162 lb 11.2 oz (73.8 kg) IBW/kg (Calculated) : 61.6 Heparin Dosing Weight: 70kg  Vital Signs: Temp: 98.7 F (37.1 C) (03/22 0800) Temp Source: Oral (03/22 0800) BP: 151/85 mmHg (03/22 0900) Pulse Rate: 83 (03/22 0900)  Labs:  Recent Labs  01/05/15 0343 01/05/15 2107 01/06/15 0321 01/06/15 0927 01/06/15 1500 01/06/15 2020 01/06/15 2346 01/07/15 0741  HGB 11.5* 12.0  --   --   --   --   --  12.7  HCT 35.5* 36.5  --   --   --   --   --  37.8  PLT 259 236  --   --   --   --   --  242  LABPROT  --  13.7  --   --   --   --   --   --   INR  --  1.04  --   --   --   --   --   --   HEPARINUNFRC  --   --   --   --   --  <0.10* 0.13* 0.30  CREATININE 0.61 0.90 0.69  --   --   --   --  0.82  TROPONINI  --   --   --  7.77* 8.02* 8.52*  --   --     Estimated Creatinine Clearance: 76.3 mL/min (by C-G formula based on Cr of 0.82).   Medical History: Past Medical History  Diagnosis Date  . CAD (coronary artery disease) 01/04/15    Inferior STEMI  . Tobacco abuse    Assessment: 54 year old female now s/p cath complicated with dissection of the circumflex, Dr.Berry unable to re-wire the coronary. Heparin restarted yesterday as part of medical management. No overt bleeding/hematoma noted post cath or this morning.  Goal of Therapy:  Heparin level 0.3-0.7 units/ml Monitor platelets by anticoagulation protocol: Yes   Plan:  D/c heparin   Sheppard CoilFrank Glennda Weatherholtz PharmD., BCPS Clinical Pharmacist Pager [phone] 01/07/2015 9:34 AM

## 2015-01-07 NOTE — Progress Notes (Signed)
Report called to receiving Rn [id number]. Patient with no complaints at the current time. Will transfer via WC.

## 2015-01-07 NOTE — Progress Notes (Signed)
CARDIAC REHAB PHASE I   PRE:  Rate/Rhythm: 70 SR    BP: sitting 117/76    SaO2:   MODE:  Ambulation: 740 ft   POST:  Rate/Rhythm: 82 SR    BP: sitting 136/88     SaO2:   No c/o walking, sts she feels fine. Pt seems overwhelmed and angry with her situation. Sts all she has time for is work and caring for her husband. Attempted to encourage pt to pursue a support group but she sts that takes time as well. Ed completed with good understanding. Pt motivated to quit smoking and sts she did quit cold [place] for 4 years. Discussed other ways of dealing with stress instead of smoking. Left diet and ex gl for pt but did not discuss as she can only focus on quitting smoking and taking meds right now. Not interested in CRPII due to lack of time. She can walk as tolerated. No CP today. [phone]   Sara LovettReeve, Sara Leifheit DudleyKristan CES, ACSM 01/07/2015 2:09 PM

## 2015-01-07 NOTE — Progress Notes (Signed)
ANTICOAGULATION CONSULT NOTE Pharmacy Consult for heparin Indication: chest pain/ACS  Allergies  Allergen Reactions  . Penicillins     Patient Measurements: Height: 5\' 7"  (170.2 cm) Weight: 162 lb 11.2 oz (73.8 kg) IBW/kg (Calculated) : 61.6 Heparin Dosing Weight: 70kg  Vital Signs: Temp: 99.1 F (37.3 C) (03/22 0000) Temp Source: Oral (03/22 0000) BP: 110/67 mmHg (03/22 0000) Pulse Rate: 65 (03/22 0000)  Labs:  Recent Labs  01/04/15 1932  01/05/15 0343 01/05/15 2107 01/06/15 0321 01/06/15 0927 01/06/15 1500 01/06/15 2020 01/06/15 2346  HGB 11.9*  --  11.5* 12.0  --   --   --   --   --   HCT 35.7*  --  35.5* 36.5  --   --   --   --   --   PLT 266  --  259 236  --   --   --   --   --   LABPROT  --   --   --  13.7  --   --   --   --   --   INR  --   --   --  1.04  --   --   --   --   --   HEPARINUNFRC  --   --   --   --   --   --   --  <0.10* 0.13*  CREATININE  --   --  0.61 0.90 0.69  --   --   --   --   TROPONINI  --   < >  --   --   --  7.77* 8.02* 8.52*  --   < > = values in this interval not displayed.  Estimated Creatinine Clearance: 78.2 mL/min (by C-G formula based on Cr of 0.69).  Assessment: 54 year old female with STEMI s/p PCI 3/19, cath 3/21, for heparin  Goal of Therapy:  Heparin level 0.3-0.7 units/ml Monitor platelets by anticoagulation protocol: Yes   Plan:  Increase Heparin [id number] units/hr Check heparin level in 8 hours.    Geannie RisenGreg Shelaine Frie, PharmD, BCPS 01/07/2015 12:22 AM

## 2015-01-07 NOTE — Progress Notes (Signed)
DAILY PROGRESS NOTE  Subjective:  Chest pain has improved overnight. Echo yesterday shows low normal EF of 50-55%, possible inferior hypokinesis. Mild MR. Troponins have significantly decreased despite the small dissection yesterday - there was TIMI 2 flow in the vessel.  She is not interested in repeat cardiac catheterization.  Objective:  Temp:  [97.5 F (36.4 C)-99.1 F (37.3 C)] 98.7 F (37.1 C) (03/22 0800) Pulse Rate:  [63-83] 83 (03/22 0900) Resp:  [8-19] 18 (03/22 0800) BP: (98-170)/(53-100) 151/85 mmHg (03/22 0900) SpO2:  [95 %-100 %] 99 % (03/22 0900) Weight change:   Intake/Output from previous day: 03/21 0701 - 03/22 0700 In: 1980.9 [P.O.:1320; I.V.:660.9] Out: 1300 [Urine:1300]  Intake/Output from this shift: Total I/O In: 14.5 [I.V.:14.5] Out: -   Medications: Current Facility-Administered Medications  Medication Dose Route Frequency Provider Last Rate Last Dose  . acetaminophen (TYLENOL) tablet 650 mg  650 mg Oral Q4H PRN Runell Gess, MD   650 mg at 01/07/15 0255  . aspirin chewable tablet 81 mg  81 mg Oral Daily Runell Gess, MD   81 mg at 01/05/15 0844  . atorvastatin (LIPITOR) tablet 80 mg  80 mg Oral q1800 Brittainy Sherlynn Carbon, PA-C   80 mg at 01/06/15 1727  . heparin ADULT infusion 100 units/mL (25000 units/250 mL)  1,150 Units/hr Intravenous Continuous Runell Gess, MD 11.5 mL/hr at 01/07/15 0040 1,150 Units/hr at 01/07/15 0040  . lisinopril (PRINIVIL,ZESTRIL) tablet 2.5 mg  2.5 mg Oral Daily Lars Masson, MD   2.5 mg at 01/06/15 0915  . LORazepam (ATIVAN) tablet 1 mg  1 mg Oral Q8H PRN Dolores Patty, MD   1 mg at 01/06/15 1018  . LORazepam (ATIVAN) tablet 1 mg  1 mg Oral 3 times per day Runell Gess, MD   1 mg at 01/07/15 0636  . metoprolol tartrate (LOPRESSOR) tablet 12.5 mg  12.5 mg Oral BID Allayne Butcher, PA-C   12.5 mg at 01/06/15 2152  . morphine 2 MG/ML injection 2 mg  2 mg Intravenous Q2H PRN [name], MD    2 mg at 01/06/15 2005  . morphine 2 MG/ML injection 2-4 mg  2-4 mg Intravenous Q1H PRN Glori Luis, MD   4 mg at 01/06/15 2308  . nitroGLYCERIN 50 mg in dextrose 5 % 250 mL (0.2 mg/mL) infusion  0-200 mcg/min Intravenous Titrated [name], MD 1.5 mL/hr at 01/07/15 0600 5 mcg/min at 01/07/15 0600  . ondansetron (ZOFRAN) injection 4 mg  4 mg Intravenous Q6H PRN Runell Gess, MD      . ticagrelor [facility]) tablet 90 mg  90 mg Oral BID Runell Gess, MD   90 mg at 01/06/15 2152    Physical Exam: General appearance: alert and no distress Neck: no carotid bruit and no JVD Lungs: clear to auscultation bilaterally Heart: regular rate and rhythm, S1, S2 normal, no murmur, click, rub or gallop Abdomen: soft, non-tender; bowel sounds normal; no masses,  no organomegaly Extremities: extremities normal, atraumatic, no cyanosis or edema Pulses: 2+ and symmetric Skin: Skin color, texture, turgor normal. No rashes or lesions Neurologic: Grossly normal  Lab Results: Results for orders placed or performed during the hospital encounter of 01/04/15 (from the past 48 hour(s))  Basic metabolic panel     Status: Abnormal   Collection Time: 01/05/15  9:07 PM  Result Value Ref Range   Sodium 139 135 - 145 mmol/L   Potassium 3.7 3.5 -  5.1 mmol/L   Chloride 108 96 - 112 mmol/L   CO2 27 19 - 32 mmol/L   Glucose, Bld 88 70 - 99 mg/dL   BUN 9 6 - 23 mg/dL   Creatinine, Ser 0.90 0.50 - 1.10 mg/dL   Calcium 8.8 8.4 - 10.5 mg/dL   GFR calc non Af Amer 71 (L) >90 mL/min   GFR calc Af Amer 83 (L) >90 mL/min    Comment: (NOTE) The eGFR has been calculated using the CKD EPI equation. This calculation has not been validated in all clinical situations. eGFR's persistently <90 mL/min signify possible Chronic Kidney Disease.    Anion gap 4 (L) 5 - 15  Protime-INR     Status: None   Collection Time: 01/05/15  9:07 PM  Result Value Ref Range   Prothrombin Time 13.7 11.6 - 15.2 seconds   INR 1.04  0.00 - 1.49  CBC     Status: None   Collection Time: 01/05/15  9:07 PM  Result Value Ref Range   WBC 8.3 4.0 - 10.5 K/uL   RBC 3.91 3.87 - 5.11 MIL/uL   Hemoglobin 12.0 12.0 - 15.0 g/dL   HCT 36.5 36.0 - 46.0 %   MCV 93.4 78.0 - 100.0 fL   MCH 30.7 26.0 - 34.0 pg   MCHC 32.9 30.0 - 36.0 g/dL   RDW 13.3 11.5 - 15.5 %   Platelets 236 150 - 400 K/uL  Basic metabolic panel     Status: Abnormal   Collection Time: 01/06/15  3:21 AM  Result Value Ref Range   Sodium 140 135 - 145 mmol/L   Potassium 3.3 (L) 3.5 - 5.1 mmol/L   Chloride 110 96 - 112 mmol/L   CO2 22 19 - 32 mmol/L   Glucose, Bld 97 70 - 99 mg/dL   BUN 8 6 - 23 mg/dL   Creatinine, Ser 0.69 0.50 - 1.10 mg/dL   Calcium 8.8 8.4 - 10.5 mg/dL   GFR calc non Af Amer >90 >90 mL/min   GFR calc Af Amer >90 >90 mL/min    Comment: (NOTE) The eGFR has been calculated using the CKD EPI equation. This calculation has not been validated in all clinical situations. eGFR's persistently <90 mL/min signify possible Chronic Kidney Disease.    Anion gap 8 5 - 15  POCT Activated clotting time     Status: None   Collection Time: 01/06/15  7:56 AM  Result Value Ref Range   Activated Clotting Time 423 seconds  Troponin I (q 6hr x 3)     Status: Abnormal   Collection Time: 01/06/15  9:27 AM  Result Value Ref Range   Troponin I 7.77 (HH) <0.031 ng/mL    Comment:        POSSIBLE MYOCARDIAL ISCHEMIA. SERIAL TESTING RECOMMENDED. REPEATED TO VERIFY CRITICAL VALUE NOTED.  VALUE IS CONSISTENT WITH PREVIOUSLY REPORTED AND CALLED VALUE.   Troponin I (q 6hr x 3)     Status: Abnormal   Collection Time: 01/06/15  3:00 PM  Result Value Ref Range   Troponin I 8.02 (HH) <0.031 ng/mL    Comment:        POSSIBLE MYOCARDIAL ISCHEMIA. SERIAL TESTING RECOMMENDED. REPEATED TO VERIFY CRITICAL VALUE NOTED.  VALUE IS CONSISTENT WITH PREVIOUSLY REPORTED AND CALLED VALUE.   Troponin I (q 6hr x 3)     Status: Abnormal   Collection Time: 01/06/15  8:20 PM    Result Value Ref Range   Troponin I 8.52 (  HH) <0.031 ng/mL    Comment:        POSSIBLE MYOCARDIAL ISCHEMIA. SERIAL TESTING RECOMMENDED. REPEATED TO VERIFY CRITICAL VALUE NOTED.  VALUE IS CONSISTENT WITH PREVIOUSLY REPORTED AND CALLED VALUE.   Heparin level (unfractionated)     Status: Abnormal   Collection Time: 01/06/15  8:20 PM  Result Value Ref Range   Heparin Unfractionated <0.10 (L) 0.30 - 0.70 IU/mL    Comment:        IF HEPARIN RESULTS ARE BELOW EXPECTED VALUES, AND PATIENT DOSAGE HAS BEEN CONFIRMED, SUGGEST FOLLOW UP TESTING OF ANTITHROMBIN III LEVELS.   Heparin level (unfractionated)     Status: Abnormal   Collection Time: 01/06/15 11:46 PM  Result Value Ref Range   Heparin Unfractionated 0.13 (L) 0.30 - 0.70 IU/mL    Comment:        IF HEPARIN RESULTS ARE BELOW EXPECTED VALUES, AND PATIENT DOSAGE HAS BEEN CONFIRMED, SUGGEST FOLLOW UP TESTING OF ANTITHROMBIN III LEVELS.   Basic metabolic panel     Status: Abnormal   Collection Time: 01/07/15  7:41 AM  Result Value Ref Range   Sodium 137 135 - 145 mmol/L   Potassium 4.8 3.5 - 5.1 mmol/L    Comment: HEMOLYZED SPECIMEN, RESULTS MAY BE AFFECTED   Chloride 106 96 - 112 mmol/L   CO2 22 19 - 32 mmol/L   Glucose, Bld 101 (H) 70 - 99 mg/dL   BUN 8 6 - 23 mg/dL   Creatinine, Ser 0.82 0.50 - 1.10 mg/dL   Calcium 8.9 8.4 - 10.5 mg/dL   GFR calc non Af Amer 80 (L) >90 mL/min   GFR calc Af Amer >90 >90 mL/min    Comment: (NOTE) The eGFR has been calculated using the CKD EPI equation. This calculation has not been validated in all clinical situations. eGFR's persistently <90 mL/min signify possible Chronic Kidney Disease.    Anion gap 9 5 - 15  CBC     Status: None   Collection Time: 01/07/15  7:41 AM  Result Value Ref Range   WBC 9.9 4.0 - 10.5 K/uL   RBC 4.12 3.87 - 5.11 MIL/uL   Hemoglobin 12.7 12.0 - 15.0 g/dL   HCT 37.8 36.0 - 46.0 %   MCV 91.7 78.0 - 100.0 fL   MCH 30.8 26.0 - 34.0 pg   MCHC 33.6 30.0 -  36.0 g/dL   RDW 13.0 11.5 - 15.5 %   Platelets 242 150 - 400 K/uL  Heparin level (unfractionated)     Status: None   Collection Time: 01/07/15  7:41 AM  Result Value Ref Range   Heparin Unfractionated 0.30 0.30 - 0.70 IU/mL    Comment:        IF HEPARIN RESULTS ARE BELOW EXPECTED VALUES, AND PATIENT DOSAGE HAS BEEN CONFIRMED, SUGGEST FOLLOW UP TESTING OF ANTITHROMBIN III LEVELS.     Imaging: No results found.  Assessment:  Principal Problem:   ST elevation myocardial infarction (STEMI) of inferior wall Active Problems:   Inferior STEMI (ST elevation myocardial infarction)   Tobacco abuse   Hyperlipidemia   Essential hypertension   Hypokalemia   IFG (impaired fasting glucose)   Plan:  1. Inferior STEMI - successful PCI of 2 long segments of the RCA. 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 An attempt was made at PCI, but was complicated by spiral dissection with TIMI II flow at the end of the procedure. LV function, however, remains good. No chest pain currently. She wishes to proceed with  medical therapy at this time and does not want re-look catheterization +/- an attempt to repair the LCX.  D/c nitro gtts today. D/C heparin. Start imdur 15 mg daily. 2. HTN - BP improved, pain was probably a big factor yesterday. Continue ACE-I, BB, and adding imdur today. 3. Impaired fasting glucose - A1c is 5.6. Will need outpatient follow-up. 4. Hypokalemia - resolved 5. Tobacco abuse - continued counseling regarding smoking cessation.  Ok to transfer to telemetry today. Anticipate possible d/c tomorrow.  Time Spent Directly with Patient:  30 minutes  Length of Stay:  LOS: 3 days   Pixie Casino, MD, [facility] Attending Cardiologist [facility]  Martie Fulgham C 01/07/2015, 9:46 AM

## 2015-01-07 NOTE — Progress Notes (Signed)
Patient c/o continued 5/10 pain in collar bone/shoulder area bilaterally, extending up to back of neck, as well as a headache. Patient stated that she was not having any pain in her chest, heavyness, SOB, or diaphoresis. She stated that the pain would increase and decrease in intensity at times but had yet to go away completely. Administered 2mg  morphine at 20:05. Headache resolved but shoulder and neck pain continued. Attempted to reposition patient, applied hot packs to area and gave 1mg  ativan at 21:50. Dr. Zachery ConchFriedman notified of continued pain. EKG performed to rule out changes, morphine 2-4mg  PRN ordered and nitroglycerin gtt increased from 5mcg to 10mcg. 4mg  morphine given at 2300 with some decrease in pain intensity. Patient now resting comfortably. Will continue to monitor.

## 2015-01-08 ENCOUNTER — Encounter (HOSPITAL_COMMUNITY): Payer: Self-pay | Admitting: Physician Assistant

## 2015-01-08 ENCOUNTER — Telehealth: Payer: Self-pay | Admitting: Nurse Practitioner

## 2015-01-08 LAB — CBC
HCT: 40.6 % (ref 36.0–46.0)
Hemoglobin: 13.1 g/dL (ref 12.0–15.0)
MCH: 29.5 pg (ref 26.0–34.0)
MCHC: 32.3 g/dL (ref 30.0–36.0)
MCV: 91.4 fL (ref 78.0–100.0)
Platelets: 263 10*3/uL (ref 150–400)
RBC: 4.44 MIL/uL (ref 3.87–5.11)
RDW: 13.1 % (ref 11.5–15.5)
WBC: 10.3 10*3/uL (ref 4.0–10.5)

## 2015-01-08 LAB — BASIC METABOLIC PANEL
Anion gap: 12 (ref 5–15)
BUN: 9 mg/dL (ref 6–23)
CALCIUM: 9.1 mg/dL (ref 8.4–10.5)
CO2: 18 mmol/L — ABNORMAL LOW (ref 19–32)
Chloride: 106 mmol/L (ref 96–112)
Creatinine, Ser: 0.67 mg/dL (ref 0.50–1.10)
GFR calc Af Amer: >90 mL/min (ref >90)
GLUCOSE: 87 mg/dL (ref 70–99)
POTASSIUM: 3.7 mmol/L (ref 3.5–5.1)
Sodium: 136 mmol/L (ref 135–145)

## 2015-01-08 MED ORDER — NITROGLYCERIN 0.4 MG SL SUBL: 0.4000 mg | SUBLINGUAL_TABLET | SUBLINGUAL | Status: DC | PRN

## 2015-01-08 MED ORDER — METOPROLOL TARTRATE 25 MG PO TABS: 12.5000 mg | ORAL_TABLET | Freq: Two times a day (BID) | ORAL | Status: DC

## 2015-01-08 MED ORDER — TICAGRELOR 90 MG PO TABS: 90.0000 mg | ORAL_TABLET | Freq: Two times a day (BID) | ORAL | Status: DC

## 2015-01-08 MED ORDER — LISINOPRIL 5 MG PO TABS: 5.0000 mg | ORAL_TABLET | Freq: Every day | ORAL | Status: DC

## 2015-01-08 MED ORDER — ASPIRIN 81 MG PO TABS
81.0000 mg | ORAL_TABLET | Freq: Every day | ORAL | Status: AC
Start: 2015-01-08 — End: ?

## 2015-01-08 MED ORDER — ATORVASTATIN CALCIUM 80 MG PO TABS: 80.0000 mg | ORAL_TABLET | Freq: Every day | ORAL | Status: DC

## 2015-01-08 NOTE — Discharge Instructions (Signed)
PLEASE REMEMBER TO BRING ALL OF YOUR MEDICATIONS TO EACH OF YOUR FOLLOW-UP OFFICE VISITS. ° °PLEASE ATTEND ALL SCHEDULED FOLLOW-UP APPOINTMENTS.  ° °Activity: Increase activity slowly as tolerated. You may shower, but no soaking baths (or swimming) for 1 week. No driving for 2 days. No lifting over 5 lbs for 1 week. No sexual activity for 1 week.  ° °You May Return to Work: in 1 week (if applicable) ° °Wound Care: You may wash cath site gently with soap and water. Keep cath site clean and dry. If you notice pain, swelling, bleeding or pus at your cath site, please call [phone]. ° ° ° °Cardiac Cath Site Care °Refer to this sheet in the next few weeks. These instructions provide you with information on caring for yourself after your procedure. Your caregiver may also give you more specific instructions. Your treatment has been planned according to current medical practices, but problems sometimes occur. Call your caregiver if you have any problems or questions after your procedure. °HOME CARE INSTRUCTIONS °· You may shower 24 hours after the procedure. Remove the bandage (dressing) and gently wash the site with plain soap and water. Gently pat the site dry.  °· Do not apply powder or lotion to the site.  °· Do not sit in a bathtub, swimming pool, or whirlpool for 5 to 7 days.  °· No bending, squatting, or lifting anything over 10 pounds (4.5 kg) as directed by your caregiver.  °· Inspect the site at least twice daily.  °· Do not drive home if you are discharged the same day of the procedure. Have someone else drive you.  °· You may drive 24 hours after the procedure unless otherwise instructed by your caregiver.  °What to expect: °· Any bruising will usually fade within 1 to 2 weeks.  °· Blood that collects in the tissue (hematoma) may be painful to the touch. It should usually decrease in size and tenderness within 1 to 2 weeks.  °SEEK IMMEDIATE MEDICAL CARE IF: °· You have unusual pain at the site or down the  affected limb.  °· You have redness, warmth, swelling, or pain at the site.  °· You have drainage (other than a small amount of blood on the dressing).  °· You have chills.  °· You have a fever or persistent symptoms for more than 72 hours.  °· You have a fever and your symptoms suddenly get worse.  °· Your leg becomes pale, cool, tingly, or numb.  °· You have heavy bleeding from the site. Hold pressure on the site.  °Document Released: 11/06/2010 Document Revised: 09/23/2011 Document Reviewed: 11/06/2010 °[employer]® Patient Information ©2012 [employer]. ° °

## 2015-01-08 NOTE — Discharge Summary (Signed)
CARDIOLOGY DISCHARGE SUMMARY   Patient ID: Sara Trujillo MRN: [id number] DOB/AGE: January 11, 1961 54 y.o.  Admit date: 01/04/2015 Discharge date: 01/08/2015  PCP: No primary care provider on file. Primary Cardiologist: Dr. Burt Knack  Primary Discharge Diagnosis:  STEMI of inferior wall,s/p DES RCA, failed PCI circumflex secondary to dissection, EF preserved   Secondary Discharge Diagnosis:    Tobacco abuse   Hyperlipidemia   Essential hypertension   Hypokalemia   IFG (impaired fasting glucose)  Procedures: Cardiac catheterization, coronary arteriogram, left ventriculogram, 2.25 x 12 mm Xience DES RCA, recath w/ attempted PCI CFX, 2-D echocardiogram  Hospital Course: Sara Trujillo is a 54 y.o. female with no history of CAD. She developed severe substernal chest pain on the day of admission. She called EMS and her ECG was consistent with an inferior STEMI. She was transported emergently to the hospital then taken directly to the Cath Lab.  Cardiac catheterization results are below. She had severe 2 vessel disease with the RCA felt to be the culprit vessel. It was treated with stenting. Her EF was preserved.  She was taken back to the cath lab 2 days later for intervention on her circumflex since her kidney function was preserved and she was otherwise doing well.  Those results are below. The procedure was attempted but was complicated by dissection was unsuccessful. It could not be stented and medical therapy was recommended.  She was started on aspirin, statin, an ACE inhibitor and a beta blocker. She was also started on low-dose nitrates. She was tolerating the medications in the dual antiplatelet therapy well. Her hemoglobin A1c was mildly elevated at 5.8. This was discussed with the patient and she was advised to limit carbohydrates and processed sugars. Smoking cessation was discussed with the patient and she is struggling of the ominous but refused a nicotine patch. She  has some Ativan at home that she is using when necessary for stress and will continue this.  She was seen by cardiac rehabilitation and educated on MI restrictions, heart-healthy lifestyle modifications and exercise guidelines.  Her husband has a significant illness and she is concerned about using her time off after this event. She discussed the situation with Dr. Burt Knack. He felt that if she had early follow-up in the office and was doing well, she could return to work next Thursday, 03/31. On 01/08/2015, she was evaluated by Dr. Burt Knack and all data were reviewed. She was ambulating without chest pain or shortness of breath and considered stable for discharge, to follow-up early next week in the office.  Labs:   Lab Results  Component Value Date   WBC 10.3 01/08/2015   HGB 13.1 01/08/2015   HCT 40.6 01/08/2015   MCV 91.4 01/08/2015   PLT 263 01/08/2015     Recent Labs Lab 01/08/15 0329  NA 136  K 3.7  CL 106  CO2 18*  BUN 9  CREATININE 0.67  CALCIUM 9.1  GLUCOSE 87    Recent Labs  01/06/15 0927 01/06/15 1500 01/06/15 2020  TROPONINI 7.77* 8.02* 8.52*   Lipid Panel     Component Value Date/Time   CHOL 236* 01/04/2015 0720   TRIG 154* 01/04/2015 0720   HDL 35* 01/04/2015 0720   CHOLHDL 6.7 01/04/2015 0720   VLDL 31 01/04/2015 0720   LDLCALC 170* 01/04/2015 0720    Recent Labs  01/05/15 2107  INR 1.04   Radiology:  None  Cardiac Cath: 01/04/2015 ANGIOGRAPHIC RESULTS:  1. Left main; normal  2. LAD;  60% proximal, first diagonal branch was small [place] and moderate in length and had 50-60% stenosis in its proximal third 3. Left circumflex; nondominant with a 90% mid AV groove, there was a ramus branch that had an 80% segmental mid stenosis.  4. Right coronary artery; dominant with occlusion at the Genu . This was the infarct related artery 5. Left ventriculography; RAO left ventriculogram was performed using  25 mL of Visipaque dye at 12 mL/second. The  overall LVEF estimated  60 % With wall motion abnormalities notable for a true apical severe hypokinesia IMPRESSION:Sara Trujillo has an occluded RCA responsible for her inferior STEMI with high-grade disease in her circumflex and moderate disease in her ramus intermedius and LAD/diagonal branch. We'll proceed with PCI and stenting of her RCA Procedure description: The patient received Brilenta 180 mg by mouth addition to angina bolus and without infusion. Her ACT was 417. Total contrast administered and the case was 135 mL. A 6 [place] guide catheter along with a 0.14/190 cm [place] guidewire and that 2 mm x 12 mm long balloon angioplasty was performed. There was moderate disease in the proximal right, high-grade disease it at the Genu and crux of the vessel. B segments were dilated with a balloon. Following this a 2.25 x 12 mm long science drug-eluting stent was then carefully positioned angiographically and deployed in the distal RCA just before the takeoff of the PDA as well as at the Genu. There were post dilated with 2.5 mm millimeter by 8 mm long noncompliant balloons up to 16 atm (2.6) resulting reduction total occlusion to 0% residual with TIMI-3 flow. The patient was stable at the end of the procedure however while still on the Cath Lab table she developed recurrent chest pain and EKG showed ST segment elevation in the inferior leads and therefore it was decided to repeat angiography to determine patency of the RCA. Overall impression: Successful RCA PCI and stenting of distal RCA with 2 drug-eluting stents with residual disease in the nondominant circumflex, LAD diagonal branch and ramus branch with preserved LV function. The door to balloon time was 27 minutes.  Recath 01/06/2015 ANGIOGRAPHIC RESULTS:  1. RCA: The RCA was widely patent. 2: LAD-60% smooth proximal LAD 3: Left circumflex-95-99% focal mid AV groove circumflex with some mild post stenotic dilatation IMPRESSION:Ms.  Trujillo has a widely patent RCA intervened on 2 days ago with high-grade mid AV groove circumflex disease. We'll proceed with PCI and stenting of the mid nondominant AV groove circumflex. Procedure description: The patient received angina exposed with an ACT of 423. Total contrast administered to the patient was 160 mL. I initially image the RCA which I demonstrated to be widely patent. I then used a 6 [place] XB 3 cm curved guide catheter along with an 014/190 cm long pro-water guidewire. The patient was already on aspirin and Brilenta. The guidewire initially would not cross the lesion and therefore I used a 20/12 mm long balloon for "support". This crossed with some difficulty and ultimately went subintimal. I then switched out to an L1 for [facility] XT wire and attempt to recross and regain access into the true lumen and successfully. I reviewed the films with Dr. Martinique and we both agree that there are no percutaneous options at this time. Patient will not go to [facility] bypass grafting for a circumflex infarct. Plans will be for medical therapy with nitrates, analgesics and heparin. Final impression: Widely patent RCA with unsuccessful attempt at crossing the mid high-grade nondominant AV  groove circumflex lesion, located by distal dissection unable to regain true lumen. The patient circumflex infarct will be treated medically with IV nitroglycerin, analgesics and heparin.   EKG: 01/07/2015 Sinus rhythm, inferior T-wave changes Heart rate 79  Echo: 01/06/2015 Conclusions - Left ventricle: Wall thickness was increased in a pattern of moderate LVH. Systolic function was normal. The estimated ejection fraction was in the range of 50% to 55%. Probable mild hypokinesis of the inferior myocardium. Doppler parameters are consistent with abnormal left ventricular relaxation (grade 1 diastolic dysfunction). - Mitral valve: There was mild regurgitation.  FOLLOW UP PLANS AND  APPOINTMENTS Allergies  Allergen Reactions  . Penicillins      Medication List    TAKE these medications        aspirin 81 MG tablet  Take 1 tablet (81 mg total) by mouth daily.     atorvastatin 80 MG tablet  Commonly known as:  LIPITOR  Take 1 tablet (80 mg total) by mouth daily.     lisinopril 5 MG tablet  Commonly known as:  PRINIVIL,ZESTRIL  Take 1 tablet (5 mg total) by mouth daily.     LORazepam 1 MG tablet  Commonly known as:  ATIVAN  Take 1 mg by mouth every 8 (eight) hours.     metoprolol tartrate 25 MG tablet  Commonly known as:  LOPRESSOR  Take 0.5 tablets (12.5 mg total) by mouth 2 (two) times daily.     nitroGLYCERIN 0.4 MG SL tablet  Commonly known as:  NITROSTAT  Place 1 tablet (0.4 mg total) under the tongue every 5 (five) minutes as needed for chest pain.     ticagrelor 90 MG Tabs tablet  Commonly known as:  BRILINTA  Take 1 tablet (90 mg total) by mouth 2 (two) times daily.        Discharge Instructions    Diet - low sodium heart healthy    Complete by:  As directed      Increase activity slowly    Complete by:  As directed           Follow-up Information    Follow up with Truitt Merle, NP On 01/10/2015.   Specialty:  Nurse Practitioner   Why:  See provider at 2:30 pm. Please arrive 15 minutes early for paperwork.   Contact information:   [place]. [address] [place] [address] [phone]       BRING ALL MEDICATIONS WITH YOU TO FOLLOW UP APPOINTMENTS  Time spent with patient to include physician time: 42 min Signed: Rosaria Ferries, PA-C 01/08/2015, 10:45 AM Co-Sign MD

## 2015-01-08 NOTE — Progress Notes (Signed)
Pt is ready for DC home accompanied by sister. Pt reports she understands all DC medications, follow up appointments, and instructions.   Ulice Dashorie Ahmeer Tuman, RCharity fundraiser

## 2015-01-08 NOTE — Progress Notes (Signed)
Patient Name: Sara Trujillo Date of Encounter: 01/08/2015  Principal Problem:   ST elevation myocardial infarction (STEMI) of inferior wall Active Problems:   Inferior STEMI (ST elevation myocardial infarction)   Tobacco abuse   Hyperlipidemia   Essential hypertension   Hypokalemia   IFG (impaired fasting glucose)   Primary Cardiologist: Dr. Allyson Sabal  Patient Profile: 54 yo female w/ no hx CAD, admitted 03/19 w/ inf STEMI. S/P PCI RCA, attempted PCI CFX, unsuccessful, complicated by dissection.  SUBJECTIVE: No chest pain or SOB. Does not want to hear any more about smoking cessation. 5 days out, will try to quit.  OBJECTIVE Filed Vitals:   01/07/15 1300 01/07/15 1525 01/07/15 2014 01/08/15 0444  BP: 109/57 123/81 129/75 123/77  Pulse:  79 80 76  Temp:  97.6 F (36.4 C) 99.1 F (37.3 C) 98.9 F (37.2 C)  TempSrc:  Oral Oral Oral  Resp:      Height:   (1.702 m)    Weight:    161 lb 4.8 oz (73.165 kg)  SpO2:  100% 97% 99%    Intake/Output Summary (Last 24 hours) at 01/08/15 0755 Last data filed at 01/07/15 1500  Gross per 24 hour  Intake  523.5 ml  Output      0 ml  Net  523.5 ml   Filed Weights   01/04/15 1000 01/06/15 0406 01/08/15 0444  Weight: 169 lb 12.1 oz (77 kg) 162 lb 11.2 oz (73.8 kg) 161 lb 4.8 oz (73.165 kg)    PHYSICAL EXAM General: Well developed, well nourished, female in no acute distress. Head: Normocephalic, atraumatic.  Neck: Supple without bruits, JVD not elevated. Lungs:  Resp regular and unlabored, CTA. Heart: RRR, S1, S2, no S3, S4, or murmur; no rub. Abdomen: Soft, non-tender, non-distended, BS + x 4.  Extremities: No clubbing, cyanosis, no edema. Ecchymosis L hand after IV stick. Ecchymosis R groin, no bruit, no ecchymosis R radial. Neuro: Alert and oriented X 3. Moves all extremities spontaneously. Psych: Normal affect.  LABS: CBC: Recent Labs  01/07/15 0741 01/08/15 0329  WBC 9.9 10.3  HGB 12.7 13.1  HCT 37.8  40.6  MCV 91.7 91.4  PLT 242 263   INR: Recent Labs  01/05/15 2107  INR 1.04   Basic Metabolic Panel: Recent Labs  01/07/15 0741 01/08/15 0329  NA 137 136  K 4.8 3.7  CL 106 106  CO2 22 18*  GLUCOSE 101* 87  BUN 8 9  CREATININE 0.82 0.67  CALCIUM 8.9 9.1   Cardiac Enzymes: Recent Labs  01/06/15 0927 01/06/15 1500 01/06/15 2020  TROPONINI 7.77* 8.02* 8.52*    TELE:  SR, no sig ectopy  Current Medications:  . aspirin  81 mg Oral Daily  . atorvastatin  80 mg Oral q1800  . isosorbide mononitrate  15 mg Oral Daily  . lisinopril  5 mg Oral Daily  . LORazepam  1 mg Oral 3 times per day  . metoprolol tartrate  12.5 mg Oral BID  . ticagrelor  90 mg Oral BID      ASSESSMENT AND PLAN: 1. Inferior STEMI - successful PCI of 2 long segments of the RCA. 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 An attempt was made at PCI, but was complicated by spiral dissection with TIMI II flow at the end of the procedure. LV function, however, remains good. No chest pain currently. She wishes to proceed with medical therapy at this time and does not want re-look catheterization +/- an  attempt to repair the LCX. Nitro gtts & heparin D/C'd 03/22. Started on imdur 15 mg daily 03/22  2. HTN - BP improved, pain was probably a big factor 03/21. Continue ACE-I, BB, and  imdur  3. Impaired fasting glucose - A1c is 5.6. Will need outpatient follow-up.  4. Hypokalemia - resolved  5. Tobacco abuse - continued counseling regarding smoking cessation.  Possible d/c today.   Signed, Theodore DemarkRhonda Barrett , PA-C 7:55 AM 01/08/2015  Patient seen, examined. Available data reviewed. Agree with findings, assessment, and plan as outlined by Theodore Demarkhonda Barrett, PA-C. The patient is independently interviewed and examined. She is doing well. I think she is stable for discharge today. I reviewed her medications at length with her and her sister. She prefers to follow-up with me long-term. I will arrange a PA/NP visit early  next week. I would anticipate that she can return to work next Thursday. Her LVEF is preserved and she is having no recurrent angina. She understands the importance of tobacco cessation. She has had a bad headache with nitroglycerin and so I am going to stop isosorbide. She is very reluctant to miss work because she has to take vacation days and she cares for her husband who has cancer. I think a return next week will be appropriate.  Tonny BollmanMichael Mandy Peeks, M.D. 01/08/2015 9:41 AM

## 2015-01-08 NOTE — Telephone Encounter (Signed)
New message      TCM appt on 01-10-15 per Bjorn Loserhonda

## 2015-01-09 ENCOUNTER — Emergency Department (HOSPITAL_BASED_OUTPATIENT_CLINIC_OR_DEPARTMENT_OTHER): Payer: 59

## 2015-01-09 ENCOUNTER — Encounter (HOSPITAL_BASED_OUTPATIENT_CLINIC_OR_DEPARTMENT_OTHER): Payer: Self-pay | Admitting: *Deleted

## 2015-01-09 ENCOUNTER — Observation Stay (HOSPITAL_BASED_OUTPATIENT_CLINIC_OR_DEPARTMENT_OTHER)
Admission: EM | Admit: 2015-01-09 | Discharge: 2015-01-10 | Disposition: A | Discharge status: Home / Self Care | Payer: 59 | Attending: Cardiovascular Disease | Admitting: Cardiovascular Disease

## 2015-01-09 DIAGNOSIS — I2510 Atherosclerotic heart disease of native coronary artery without angina pectoris: Secondary | ICD-10-CM | POA: Insufficient documentation

## 2015-01-09 DIAGNOSIS — R079 Chest pain, unspecified: Secondary | ICD-10-CM | POA: Diagnosis present

## 2015-01-09 DIAGNOSIS — Z720 Tobacco use: Secondary | ICD-10-CM | POA: Diagnosis present

## 2015-01-09 DIAGNOSIS — Z87891 Personal history of nicotine dependence: Secondary | ICD-10-CM | POA: Diagnosis not present

## 2015-01-09 DIAGNOSIS — R7989 Other specified abnormal findings of blood chemistry: Secondary | ICD-10-CM | POA: Diagnosis not present

## 2015-01-09 DIAGNOSIS — R072 Precordial pain: Principal | ICD-10-CM | POA: Insufficient documentation

## 2015-01-09 DIAGNOSIS — R0789 Other chest pain: Secondary | ICD-10-CM

## 2015-01-09 DIAGNOSIS — I10 Essential (primary) hypertension: Secondary | ICD-10-CM | POA: Insufficient documentation

## 2015-01-09 DIAGNOSIS — Z79899 Other long term (current) drug therapy: Secondary | ICD-10-CM | POA: Insufficient documentation

## 2015-01-09 DIAGNOSIS — E785 Hyperlipidemia, unspecified: Secondary | ICD-10-CM | POA: Diagnosis present

## 2015-01-09 DIAGNOSIS — Z7982 Long term (current) use of aspirin: Secondary | ICD-10-CM | POA: Diagnosis not present

## 2015-01-09 DIAGNOSIS — I252 Old myocardial infarction: Secondary | ICD-10-CM | POA: Diagnosis not present

## 2015-01-09 DIAGNOSIS — Z955 Presence of coronary angioplasty implant and graft: Secondary | ICD-10-CM | POA: Insufficient documentation

## 2015-01-09 DIAGNOSIS — E780 Pure hypercholesterolemia: Secondary | ICD-10-CM | POA: Insufficient documentation

## 2015-01-09 DIAGNOSIS — F419 Anxiety disorder, unspecified: Secondary | ICD-10-CM | POA: Diagnosis not present

## 2015-01-09 DIAGNOSIS — F319 Bipolar disorder, unspecified: Secondary | ICD-10-CM | POA: Diagnosis not present

## 2015-01-09 DIAGNOSIS — Z9861 Coronary angioplasty status: Secondary | ICD-10-CM

## 2015-01-09 LAB — CBC WITH DIFFERENTIAL/PLATELET
BASOS PCT: 1 % (ref 0–1)
Basophils Absolute: 0.1 10*3/uL (ref 0.0–0.1)
Eosinophils Absolute: 0.3 10*3/uL (ref 0.0–0.7)
Eosinophils Relative: 3 % (ref 0–5)
HCT: 37.9 % (ref 36.0–46.0)
HEMOGLOBIN: 12.8 g/dL (ref 12.0–15.0)
LYMPHS ABS: 1.9 10*3/uL (ref 0.7–4.0)
Lymphocytes Relative: 22 % (ref 12–46)
MCH: 31.2 pg (ref 26.0–34.0)
MCHC: 33.8 g/dL (ref 30.0–36.0)
MCV: 92.4 fL (ref 78.0–100.0)
MONO ABS: 0.8 10*3/uL (ref 0.1–1.0)
MONOS PCT: 9 % (ref 3–12)
NEUTROS PCT: 65 % (ref 43–77)
Neutro Abs: 5.7 10*3/uL (ref 1.7–7.7)
Platelets: 275 10*3/uL (ref 150–400)
RBC: 4.1 MIL/uL (ref 3.87–5.11)
RDW: 12.7 % (ref 11.5–15.5)
WBC: 8.7 10*3/uL (ref 4.0–10.5)

## 2015-01-09 LAB — BASIC METABOLIC PANEL
Anion gap: 9 (ref 5–15)
BUN: 18 mg/dL (ref 6–23)
CO2: 24 mmol/L (ref 19–32)
CREATININE: 0.8 mg/dL (ref 0.50–1.10)
Calcium: 9.3 mg/dL (ref 8.4–10.5)
Chloride: 108 mmol/L (ref 96–112)
GFR calc non Af Amer: 82 mL/min — ABNORMAL LOW (ref >90)
GLUCOSE: 111 mg/dL — AB (ref 70–99)
POTASSIUM: 3.7 mmol/L (ref 3.5–5.1)
SODIUM: 141 mmol/L (ref 135–145)

## 2015-01-09 LAB — TROPONIN I: TROPONIN I: 3.36 ng/mL — AB (ref <0.031)

## 2015-01-09 MED ORDER — ENOXAPARIN SODIUM 40 MG/0.4ML ~~LOC~~ SOLN
40.0000 mg | SUBCUTANEOUS | Status: DC
  Filled 2015-01-09: qty 0.4

## 2015-01-09 MED ORDER — ASPIRIN 81 MG PO CHEW
81.0000 mg | CHEWABLE_TABLET | Freq: Every day | ORAL | Status: DC
Start: 2015-01-10 — End: 2015-01-10
  Administered 2015-01-10: 81 mg via ORAL
  Filled 2015-01-09: qty 1

## 2015-01-09 MED ORDER — NITROGLYCERIN 0.4 MG SL SUBL: 0.4000 mg | SUBLINGUAL_TABLET | SUBLINGUAL | Status: DC | PRN

## 2015-01-09 MED ORDER — LISINOPRIL 5 MG PO TABS
5.0000 mg | ORAL_TABLET | Freq: Every day | ORAL | Status: DC
  Administered 2015-01-10: 5 mg via ORAL
  Filled 2015-01-09: qty 1

## 2015-01-09 MED ORDER — ONDANSETRON HCL 4 MG/2ML IJ SOLN: 4.0000 mg | Freq: Four times a day (QID) | INTRAMUSCULAR | Status: DC | PRN

## 2015-01-09 MED ORDER — METOPROLOL TARTRATE 12.5 MG HALF TABLET
12.5000 mg | ORAL_TABLET | Freq: Two times a day (BID) | ORAL | Status: DC
  Administered 2015-01-10 (×2): 12.5 mg via ORAL
  Filled 2015-01-09 (×4): qty 1

## 2015-01-09 MED ORDER — LORAZEPAM 1 MG PO TABS
1.0000 mg | ORAL_TABLET | Freq: Three times a day (TID) | ORAL | Status: DC
  Administered 2015-01-10 (×2): 1 mg via ORAL
  Filled 2015-01-09 (×2): qty 1

## 2015-01-09 MED ORDER — ACETAMINOPHEN 325 MG PO TABS: 650.0000 mg | ORAL_TABLET | ORAL | Status: DC | PRN

## 2015-01-09 MED ORDER — ATORVASTATIN CALCIUM 80 MG PO TABS
80.0000 mg | ORAL_TABLET | Freq: Every day | ORAL | Status: DC
  Administered 2015-01-10: 80 mg via ORAL
  Filled 2015-01-09: qty 1

## 2015-01-09 MED ORDER — TICAGRELOR 90 MG PO TABS
90.0000 mg | ORAL_TABLET | Freq: Two times a day (BID) | ORAL | Status: DC
  Administered 2015-01-10 (×2): 90 mg via ORAL
  Filled 2015-01-09 (×3): qty 1

## 2015-01-09 NOTE — ED Notes (Signed)
Report given to Corey RN.

## 2015-01-09 NOTE — ED Notes (Signed)
MD at bedside. 

## 2015-01-09 NOTE — ED Notes (Signed)
Report to [facility].  

## 2015-01-09 NOTE — ED Notes (Signed)
Pollina MD at bedside. Ekg done and given to ED for review at bedside

## 2015-01-09 NOTE — ED Notes (Signed)
Chest pain after a stressful event today. She had 3 cardiac stents 5 days ago.

## 2015-01-09 NOTE — ED Provider Notes (Addendum)
CSN: [id number]     Arrival date & time 01/09/15  1903 History  This chart was scribed for Gilda Creasehristopher J Neev Mcmains, MD by Roxy Cedarhandni Bhalodia, ED Scribe. This patient was seen in room MH01/MH01 and the patient's care was started at 7:16 PM.   Chief Complaint  Patient presents with  . Chest Pain   Patient is a 54 y.o. female presenting with chest pain. The history is provided by the patient. No language interpreter was used.  Chest Pain Pain location:  Substernal area Pain quality: aching, sharp and shooting   Pain radiates to:  L shoulder and R shoulder Pain radiates to the back: no   Pain severity:  Moderate Timing:  Constant Progression:  Waxing and waning Chronicity:  Recurrent Context: stress   Associated symptoms: no diaphoresis and no nausea    HPI Comments: Sara Trujillo is a 54 y.o. female with a PMHx of CAD, tobacco abuse, hypertension, high cholesterol, MI, GERD, and anxiety, who presents to the Emergency Department complaining of moderate chest pain that initially began 5 days ago and [facility] She states that she had 3 cardiac stents placed during that visit. Patient reports onset of recurrent chest pain today after increased stress with having to deal with "putting her dog down" earlier today. She reports sharp, substernal that radiates to both shoulders. Patient states that the pain lasted for 3-4 minutes before she used 2 doses of nitroglycerine. She states that each dose of nitroglycerin helped relieve some pain. She states that today was the first time she has used nitroglycerin for her symptoms. She denies associated shortness of breath, diaphoresis or nausea.   Past Medical History  Diagnosis Date  . CAD (coronary artery disease) 01/04/15    Inferior STEMI; EF 60% at cath, 50-55% by echo  . Tobacco abuse   . Hypertension   . High cholesterol   . Myocardial infarction 01/04/2015; 01/06/2015    Same admission, 1 st one 2nd RCA (stented), 2nd one due to  dissection during attempted PCI CFX  . GERD (gastroesophageal reflux disease)   . Daily headache   . Anxiety   . Depression   . Bipolar disorder    Past Surgical History  Procedure Laterality Date  . Left heart catheterization with coronary angiogram N/A 01/04/2015    Procedure: LEFT HEART CATHETERIZATION WITH CORONARY ANGIOGRAM;  Surgeon: Runell GessJonathan J Berry, MD; LAD 60%, D1 60%, CFX 90%, RI 80%, RCA 100%>>0% w/  2.25 x 12 mm Xience DES, EF 60%    . Percutaneous coronary stent intervention (pci-s)  01/04/2015    Procedure: PERCUTANEOUS CORONARY STENT INTERVENTION (PCI-S);  Surgeon: Runell GessJonathan J Berry, MD;  2.25 x 12 mm Xience DES to RCA    . Percutaneous coronary stent intervention (pci-s) N/A 01/06/2015    Procedure: PERCUTANEOUS CORONARY STENT INTERVENTION (PCI-S);  Surgeon: Runell GessJonathan J Berry, MD; RCA OK, CFX unsuccessfult PCI 2nd distal dissection   . Laparoscopic cholecystectomy  1990's   Family History  Problem Relation Age of Onset  . Family history unknown: Yes   History  Substance Use Topics  . Smoking status: Former Smoker -- 0.75 packs/day for 29 years    Types: Cigarettes    Quit date: 01/04/2015  . Smokeless tobacco: Never Used  . Alcohol Use: 0.0 oz/week    0 Standard drinks or equivalent per week     Comment: 01/07/2015 "might have a drink a few times/yr"   OB History    No data available  Review of Systems  Constitutional: Negative for diaphoresis.  Cardiovascular: Positive for chest pain.  Gastrointestinal: Negative for nausea.  All other systems reviewed and are negative.  Allergies  Penicillins  Home Medications   Prior to Admission medications   Medication Sig Start Date End Date Taking? Authorizing Provider  aspirin 81 MG tablet Take 1 tablet (81 mg total) by mouth daily. 01/08/15   Rhonda G Barrett, PA-C  atorvastatin (LIPITOR) 80 MG tablet Take 1 tablet (80 mg total) by mouth daily. 01/08/15   Rhonda G Barrett, PA-C  lisinopril (PRINIVIL,ZESTRIL) 5 MG  tablet Take 1 tablet (5 mg total) by mouth daily. 01/08/15   Rhonda G Barrett, PA-C  LORazepam (ATIVAN) 1 MG tablet Take 1 mg by mouth every 8 (eight) hours.    Historical Provider, MD  metoprolol tartrate (LOPRESSOR) 25 MG tablet Take 0.5 tablets (12.5 mg total) by mouth 2 (two) times daily. 01/08/15   Rhonda G Barrett, PA-C  nitroGLYCERIN (NITROSTAT) 0.4 MG SL tablet Place 1 tablet (0.4 mg total) under the tongue every 5 (five) minutes as needed for chest pain. 01/08/15   Joline Salt Barrett, PA-C  ticagrelor (BRILINTA) 90 MG TABS tablet Take 1 tablet (90 mg total) by mouth 2 (two) times daily. 01/08/15   Joline Salt Barrett, PA-C   Triage Vitals: BP 163/92 mmHg  Pulse 86  Temp(Src) 98.3 F (36.8 C) (Oral)  Resp 18  Ht  (1.702 m)  Wt 162 lb (73.483 kg)  BMI 25.37 kg/m2  SpO2 96%  Physical Exam  Constitutional: She is oriented to person, place, and time. She appears well-developed and well-nourished. No distress.  HENT:  Head: Normocephalic and atraumatic.  Right Ear: Hearing normal.  Left Ear: Hearing normal.  Nose: Nose normal.  Mouth/Throat: Oropharynx is clear and moist and mucous membranes are normal.  Eyes: Conjunctivae and EOM are normal. Pupils are equal, round, and reactive to light.  Neck: Normal range of motion. Neck supple.  Cardiovascular: Regular rhythm, S1 normal and S2 normal.  Exam reveals no gallop and no friction rub.   No murmur heard. Pulmonary/Chest: Effort normal and breath sounds normal. No respiratory distress. She exhibits no tenderness.  Abdominal: Soft. Normal appearance and bowel sounds are normal. There is no hepatosplenomegaly. There is no tenderness. There is no rebound, no guarding, no tenderness at McBurney's point and negative Murphy's sign. No hernia.  Musculoskeletal: Normal range of motion.  Neurological: She is alert and oriented to person, place, and time. She has normal strength. No cranial nerve deficit or sensory deficit. Coordination normal. GCS  eye subscore is 4. GCS verbal subscore is 5. GCS motor subscore is 6.  Skin: Skin is warm, dry and intact. No rash noted. No cyanosis.  Psychiatric: She has a normal mood and affect. Her speech is normal and behavior is normal. Thought content normal.  Nursing note and vitals reviewed.  ED Course  Procedures (including critical care time)  DIAGNOSTIC STUDIES: Oxygen Saturation is 96% on RA, normal by my interpretation.    COORDINATION OF CARE: 7:22 PM- Discussed plans to order diagnostic EKG, CXR and lab work. Pt advised of plan for treatment and pt agrees.  Labs Review Labs Reviewed  BASIC METABOLIC PANEL - Abnormal; Notable for the following:    Glucose, Bld 111 (*)    GFR calc non Af Amer 82 (*)    All other components within normal limits  TROPONIN I - Abnormal; Notable for the following:    Troponin I 3.36 (*)  All other components within normal limits  CBC WITH DIFFERENTIAL/PLATELET    Imaging Review Dg Chest 2 View  01/09/2015   CLINICAL DATA:  Chest pain 15 minutes prior to admission. Former smoker.  EXAM: CHEST  2 VIEW  COMPARISON:  None.  FINDINGS: Normal cardiac silhouette and mediastinal contours. Minimal bilateral infrahilar heterogeneous opacities favored to represent atelectasis. No discrete focal airspace opacities. There is minimal pleural parenchymal thickening about the bilateral major fissures. No pleural effusion or pneumothorax. No evidence of edema. No acute osseus abnormalities. Post cholecystectomy.  IMPRESSION: Minimal infrahilar atelectasis without acute cardiopulmonary disease.   Electronically Signed   By: Simonne Come M.D.   On: 01/09/2015 19:49     EKG Interpretation   Date/Time:  Thursday January 09 2015 19:16:18 EDT Ventricular Rate:  83 PR Interval:  160 QRS Duration: 86 QT Interval:  372 QTC Calculation: 437 R Axis:   64 Text Interpretation:  Sinus rhythm with Premature atrial complexes in a  pattern of bigeminy Possible Left atrial  enlargement Nonspecific ST  abnormality Abnormal ECG No significant change since last tracing  Confirmed by Hillman Attig  MD, Levi Crass [phone]) on 01/09/2015 7:20:52 PM     MDM   Final diagnoses:  Chest pain    She presents to the ER for evaluation of chest pain. Patient reports that she had significant distress at the loss of her dog this afternoon and developed pain across her chest radiating towards both shoulders. She took sublingual nitroglycerin with improvement. The pain resolved after a second nitroglycerin.  Patient had ST elevation MI on March 19. She had stenting of her RCA. She had continued pain, had a second shunt on March 21. She had dissection of her circumflex during that stenting procedure. She therefore has residual circumflex disease. It's not clear if the pain today was caused by her circumflex disease. She did not, however, have evidence of ST elevation MI. Her troponin is elevated, but below the number that she was discharged with.   Discussed with Dr. Onalee Hua, on-call for cardiology. Patient will be transferred to [facility] for overnight observation and further rule out.  Patient is currently pain-free. She is stable for transfer. Dr. Onalee Hua did not recommend heparin or IV nitroglycerin at this time.  I personally performed the services described in this documentation, which was scribed in my presence. The recorded information has been reviewed and is accurate.     Gilda Crease, MD 01/09/15 4782  Gilda Crease, MD 01/09/15 2121

## 2015-01-09 NOTE — Telephone Encounter (Signed)
Patient contacted regarding discharge from [facility] on 01/08/15.  Patient understands to follow up with provider Sara SpillersLori Gerhardt,Sara Trujillo on 01/09/25 at 2:30 at [facility].. Patient understands discharge instructions? yes Patient understands medications and regiment? yes Patient understands to bring all medications to this visit? yes  Pt scheduled to come in tomorrow 01/10/15 two days after discharge.  Per Sara Quiethonda Barrett,Sara Trujillo she had a scheduling conflict for next week.  Pt states she had a episode of heart burn last PM but has resolved.  No c/o of CP, SOB.  Has all of medications and understands directions for taking them.  Also understands to bring meds to office visit tomorrow. No redness or swelling at (R) wrist cath site. Will call if has any problems.

## 2015-01-10 ENCOUNTER — Encounter (HOSPITAL_COMMUNITY): Payer: Self-pay | Admitting: Nurse Practitioner

## 2015-01-10 ENCOUNTER — Encounter: Payer: 59 | Admitting: Nurse Practitioner

## 2015-01-10 ENCOUNTER — Telehealth: Payer: Self-pay | Admitting: Cardiology

## 2015-01-10 DIAGNOSIS — R079 Chest pain, unspecified: Secondary | ICD-10-CM | POA: Diagnosis not present

## 2015-01-10 DIAGNOSIS — R072 Precordial pain: Secondary | ICD-10-CM | POA: Diagnosis not present

## 2015-01-10 DIAGNOSIS — I252 Old myocardial infarction: Secondary | ICD-10-CM | POA: Diagnosis not present

## 2015-01-10 DIAGNOSIS — R0789 Other chest pain: Secondary | ICD-10-CM

## 2015-01-10 DIAGNOSIS — I2510 Atherosclerotic heart disease of native coronary artery without angina pectoris: Secondary | ICD-10-CM

## 2015-01-10 DIAGNOSIS — I10 Essential (primary) hypertension: Secondary | ICD-10-CM | POA: Diagnosis not present

## 2015-01-10 DIAGNOSIS — Z9861 Coronary angioplasty status: Secondary | ICD-10-CM

## 2015-01-10 LAB — CBC
HEMATOCRIT: 57.7 % — AB (ref 36.0–46.0)
HEMOGLOBIN: 19.3 g/dL — AB (ref 12.0–15.0)
MCH: 30.6 pg (ref 26.0–34.0)
MCHC: 33.4 g/dL (ref 30.0–36.0)
MCV: 91.6 fL (ref 78.0–100.0)
PLATELETS: 137 10*3/uL — AB (ref 150–400)
RBC: 6.3 MIL/uL — ABNORMAL HIGH (ref 3.87–5.11)
RDW: 13.1 % (ref 11.5–15.5)
WBC: 5.5 10*3/uL (ref 4.0–10.5)

## 2015-01-10 LAB — CREATININE, SERUM
CREATININE: 0.74 mg/dL (ref 0.50–1.10)
GFR calc Af Amer: >90 mL/min (ref >90)

## 2015-01-10 LAB — TROPONIN I
TROPONIN I: 2.87 ng/mL — AB (ref <0.031)
Troponin I: 3.25 ng/mL (ref <0.031)
Troponin I: 1.89 ng/mL (ref <0.031)

## 2015-01-10 MED ORDER — OMEPRAZOLE MAGNESIUM 20 MG PO TBEC: 20.0000 mg | DELAYED_RELEASE_TABLET | Freq: Every day | ORAL | Status: DC

## 2015-01-10 NOTE — Progress Notes (Signed)
UR completed 

## 2015-01-10 NOTE — Discharge Summary (Signed)
Discharge Summary   Patient ID: Sara Trujillo,  MRN: [id number], DOB/AGE: 06/26/1961 54 y.o.  Admit date: 01/09/2015 Discharge date: 01/10/2015  Primary Care Provider: No primary care provider on file. Primary Cardiologist: M. Excell Seltzer, MD   Discharge Diagnoses Principal Problem:   Midsternal chest pain Active Problems:   CAD (coronary artery disease)   Hyperlipidemia   Essential hypertension   Tobacco abuse  Allergies Allergies  Allergen Reactions  . Penicillins    Procedures  None  History of Present Illness  Sara Trujillo is a 54 yo woman with h/o tobacco who presented on 3/19 with inferior STEMI treated with DES x 2 to distal and prox RCA. She was noted to have obstructive disease of the mCx and planned to have staged PCI. This was attempted prior to discharge but there was difficulty crossing the lesion and resulted in a dissection and attempt at re-crossing into the true lumen was unsuccessful. Of note, she also has moderate non-obstructive disease of LAD.  She was subsequently discharged and did well until 3/45, when she had severe emotional upset related to her dogs illness.  In that setting, she developed severe c/p and presented to the med center @ [facility] where ECG was non-acute but troponin was elevated.  She was transferred to [facility] for further eval.  Hospital Course  Troponin continued to trend down and she had no further chest pain.  Troponin elevation was felt to be secondary to prior event and it was not felt that she required any further ischemic evaluation.  As a result, she will be discharged home today in good condition.  We have arranged for f/u in 1 week.  Discharge Vitals Blood pressure 112/65, pulse 65, temperature 97.8 F (36.6 C), temperature source Oral, resp. rate 18, height  (1.702 m), weight 163 lb 3.2 oz (74.027 kg), SpO2 99 %.  Filed Weights   01/09/15 1902 01/10/15 0441  Weight: 162 lb (73.483 kg) 163 lb 3.2 oz (74.027 kg)     Labs  CBC  Recent Labs  01/09/15 1930 01/10/15 0003  WBC 8.7 5.5  NEUTROABS 5.7  --   HGB 12.8 19.3*  HCT 37.9 57.7*  MCV 92.4 91.6  PLT 275 137*   Basic Metabolic Panel  Recent Labs  01/08/15 0329 01/09/15 1930 01/10/15 0003  NA 136 141  --   K 3.7 3.7  --   CL 106 108  --   CO2 18* 24  --   GLUCOSE 87 111*  --   BUN 9 18  --   CREATININE 0.67 0.80 0.74  CALCIUM 9.1 9.3  --    Cardiac Enzymes  Recent Labs  01/10/15 0003 01/10/15 0515 01/10/15 1154  TROPONINI 3.25* 2.87* 1.89*   Disposition  Pt is being discharged home today in good condition.  Follow-up Plans & Appointments      Follow-up Information    Follow up with Ronie Spies, PA-C On 01/17/2015.   Specialty:  Cardiology   Why:  1:30 PM   Contact information:   [address] [place] [address] [phone]      Discharge Medications    Medication List    TAKE these medications        aspirin 81 MG tablet  Take 1 tablet (81 mg total) by mouth daily.     atorvastatin 80 MG tablet  Commonly known as:  LIPITOR  Take 1 tablet (80 mg total) by mouth daily.     lisinopril 5  MG tablet  Commonly known as:  PRINIVIL,ZESTRIL  Take 1 tablet (5 mg total) by mouth daily.     LORazepam 1 MG tablet  Commonly known as:  ATIVAN  Take 1 mg by mouth every 8 (eight) hours.     metoprolol tartrate 25 MG tablet  Commonly known as:  LOPRESSOR  Take 0.5 tablets (12.5 mg total) by mouth 2 (two) times daily.     nitroGLYCERIN 0.4 MG SL tablet  Commonly known as:  NITROSTAT  Place 1 tablet (0.4 mg total) under the tongue every 5 (five) minutes as needed for chest pain.     omeprazole 20 MG tablet  Commonly known as:  PRILOSEC OTC  Take 1 tablet (20 mg total) by mouth daily.     ticagrelor 90 MG Tabs tablet  Commonly known as:  BRILINTA  Take 1 tablet (90 mg total) by mouth 2 (two) times daily.       Outstanding Labs/Studies  F/U lipids/lft's in 6-8  wks.  Duration of Discharge Encounter   Greater than 30 minutes including physician time.  SignedNicolasa Ducking, Ginevra Tacker NP 01/10/2015, 6:02 PM

## 2015-01-10 NOTE — Progress Notes (Signed)
    Subjective:  The patient feels well. She denies chest pain or shortness of breath. States that her episode of discomfort in her chest lasted about 10 minutes yesterday and this was related to the emotional stress of her dog's death.  Objective:  Vital Signs in the last 24 hours: Temp:  [97.8 F (36.6 C)-98.8 F (37.1 C)] 97.8 F (36.6 C) (03/25 0441) Pulse Rate:  [37-86] 65 (03/25 0441) Resp:  [15-21] 18 (03/25 0441) BP: (112-166)/(65-92) 112/65 mmHg (03/25 0441) SpO2:  [96 %-100 %] 99 % (03/25 0441) Weight:  [162 lb (73.483 kg)-163 lb 3.2 oz (74.027 kg)] 163 lb 3.2 oz (74.027 kg) (03/25 0441)  Intake/Output from previous day: 03/24 0701 - 03/25 0700 In: 240 [P.O.:240] Out: 450 [Urine:450]  Physical Exam: Pt is alert and oriented, NAD HEENT: normal Neck: JVP - normal Lungs: CTA bilaterally CV: RRR without murmur or gallop Abd: soft, NT, Positive BS, no hepatomegaly Ext: no C/C/E, distal pulses intact and equal Skin: warm/dry no rash  Lab Results:  Recent Labs  01/09/15 1930 01/10/15 0003  WBC 8.7 5.5  HGB 12.8 19.3*  PLT 275 137*    Recent Labs  01/08/15 0329 01/09/15 1930 01/10/15 0003  NA 136 141  --   K 3.7 3.7  --   CL 106 108  --   CO2 18* 24  --   GLUCOSE 87 111*  --   BUN 9 18  --   CREATININE 0.67 0.80 0.74    Recent Labs  01/10/15 0003 01/10/15 0515  TROPONINI 3.25* 2.87*    Tele: Personally reviewed: Sinus rhythm  Assessment/Plan:  1. Chest pain, now resolved. 2. 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 Hypertension  The patient has had no recurrence of chest pain. It seems that her episode yesterday was clearly related to heavy emotional stress. Her cardiac markers are trending down from her original MI as expected. I do not see any evidence of recurrent ischemia. Her EKG shows expected evolving changes. I think she can be discharged home this morning. She plans to return to  work next Thursday. She had to cancel her office appointment today because of hospitalization. Will reschedule a post hospital visit in 1-2 weeks. She does complain of having problems with acid reflux since discharge from the hospital. Advised that she could start over-the-counter Nexium.   Tonny BollmanMichael Yolando Gillum, M.D. 01/10/2015, 10:09 AM

## 2015-01-10 NOTE — Telephone Encounter (Signed)
7 DAY TOC FU APPT PER CHRIS BERGE--APPT 01-17-15 130PM WITH DAYNA

## 2015-01-10 NOTE — Telephone Encounter (Signed)
This patient appears to be a [facility] patient (see 01/04/15 H&P - Dr Allyson SabalBerry primary cardiologist), will forward

## 2015-01-10 NOTE — Telephone Encounter (Signed)
Will need contact patient on 01/13/15 for TCM FOLLOW UP CALL

## 2015-01-10 NOTE — Discharge Instructions (Signed)
**  PLEASE REMEMBER TO BRING ALL OF YOUR MEDICATIONS TO EACH OF YOUR FOLLOW-UP OFFICE VISITS. ° °NO HEAVY LIFTING X 4 WEEKS. °NO SEXUAL ACTIVITY X 4 WEEKS. °NO DRIVING X 2 WEEKS. °NO SOAKING BATHS, HOT TUBS, POOLS, ETC., X 7 DAYS. ° ° °

## 2015-01-10 NOTE — Progress Notes (Signed)
Pt discharging via wheelchair, escorted by CNA, accompanied by family. Discharge instructions provided to patient and family, verbalize understanding and able to provide appropriate teach back. PIV discontinued, site without s/s of complication. Tele box removed and returned to unit. Denies needs or questions at this time.

## 2015-01-10 NOTE — H&P (Signed)
HPI: [name] is a 54 yo woman with h/o tobacco who presented on 3/19 with inferior STEMI treated with DES x 2 to distal and prox RCA.  She was noted to have obstructive disease of the mCx and planned to have staged PCI.  This was attempted prior to discharge but there was difficulty crossing the lesion and resulted in a dissection and attempt at re-crossing into the true lumen was unsuccessful.  Of note, she also has moderate non-obstructive disease of LAD.  Today she realized her dog of 15 years was going to have to be put down and got very upset.  She developed a "squeezing" sensation substernal.  She had not other sympotms with this.  She took SL NTG with mild relief.  Because of her recent discharge and events, family urged her to seek medical attention.  She [facility] to [facility] where initial work up was unrevealing except for a troponin of 3 which continues to down-trend from previous admission.  However, given her history she was transferred for further observation.  On arrival here, she denies any active symptoms. Of note, she states today's symptoms were different than prior admission during which she describes having 3 different constellation of symptoms.    Review of Systems:     Cardiac Review of Systems: {Y] = yes [ ]  = no  Chest Pain [  x  ]  Resting SOB [   ] Exertional SOB  [  ]  Orthopnea [  ]   Pedal Edema [   ]    Palpitations [  ] Syncope  [  ]   Presyncope [   ]  General Review of Systems: [Y] = yes [  ]=no Constitional: recent weight change [  ]; anorexia [  ]; fatigue [  ]; nausea [  ]; night sweats [  ]; fever [  ]; or chills [  ];                                                                     Dental: poor dentition[  ];   Eye : blurred vision [  ]; diplopia [   ]; vision changes [  ];  Amaurosis fugax[  ]; Resp: cough [  ];  wheezing[  ];  hemoptysis[  ]; shortness of breath[  ]; paroxysmal nocturnal dyspnea[  ]; dyspnea on exertion[  ]; or orthopnea[  ];  GI:   gallstones[  ], vomiting[  ];  dysphagia[  ]; melena[  ];  hematochezia [  ]; heartburn[  ];   GU: kidney stones [  ]; hematuria[  ];   dysuria [  ];  nocturia[  ];               Skin: rash [  ], swelling[  ];, hair loss[  ];  peripheral edema[  ];  or itching[  ]; Musculosketetal: myalgias[  ];  joint swelling[  ];  joint erythema[  ];  joint pain[  ];  back pain[  ];  Heme/Lymph: bruising[  ];  bleeding[  ];  anemia[  ];  Neuro: TIA[  ];  headaches[  ];  stroke[  ];  vertigo[  ];  seizures[  ];  paresthesias[  ];  difficulty walking[  ];  Psych:depression[  ]; anxiety[  ];  Endocrine: diabetes[  ];  thyroid dysfunction[  ];  Other:  Past Medical History  Diagnosis Date  . CAD (coronary artery disease) 01/04/15    Inferior STEMI; EF 60% at cath, 50-55% by echo  . Tobacco abuse   . Hypertension   . High cholesterol   . Myocardial infarction 01/04/2015; 01/06/2015    Same admission, 1 st one 2nd RCA (stented), 2nd one due to dissection during attempted PCI CFX  . GERD (gastroesophageal reflux disease)   . Daily headache   . Anxiety   . Depression   . Bipolar disorder     No current facility-administered medications on file prior to encounter.   Current Outpatient Prescriptions on File Prior to Encounter  Medication Sig Dispense Refill  . aspirin 81 MG tablet Take 1 tablet (81 mg total) by mouth daily.    Marland Kitchen atorvastatin (LIPITOR) 80 MG tablet Take 1 tablet (80 mg total) by mouth daily. 30 tablet 11  . lisinopril (PRINIVIL,ZESTRIL) 5 MG tablet Take 1 tablet (5 mg total) by mouth daily. 30 tablet 11  . LORazepam (ATIVAN) 1 MG tablet Take 1 mg by mouth every 8 (eight) hours.    . metoprolol tartrate (LOPRESSOR) 25 MG tablet Take 0.5 tablets (12.5 mg total) by mouth 2 (two) times daily. 60 tablet 11  . nitroGLYCERIN (NITROSTAT) 0.4 MG SL tablet Place 1 tablet (0.4 mg total) under the tongue every 5 (five) minutes as needed for chest pain. 25 tablet 3  . ticagrelor (BRILINTA) 90 MG TABS  tablet Take 1 tablet (90 mg total) by mouth 2 (two) times daily. 60 tablet 11     Allergies  Allergen Reactions  . Penicillins     History   Social History  . Marital Status: Married    Spouse Name: N/A  . Number of Children: N/A  . Years of Education: N/A   Occupational History  . Not on file.   Social History Main Topics  . Smoking status: Former Smoker -- 0.75 packs/day for 29 years    Types: Cigarettes    Quit date: 01/04/2015  . Smokeless tobacco: Never Used  . Alcohol Use: 0.0 oz/week    0 Standard drinks or equivalent per week     Comment: 01/07/2015 "might have a drink a few times/yr"  . Drug Use: No  . Sexual Activity: No   Other Topics Concern  . Not on file   Social History Narrative    Family History  Problem Relation Age of Onset  . Family history unknown: Yes    PHYSICAL EXAM: Filed Vitals:   01/09/15 2254  BP: 148/89  Pulse: 69  Temp: 98.8 F (37.1 C)  Resp: 17   General:  Well appearing. No respiratory difficulty HEENT: normal Neck: supple. no JVD. Carotids 2+ bilat; no bruits. No lymphadenopathy or thryomegaly appreciated. Cor: PMI nondisplaced. Regular rate & rhythm. No rubs, gallops or murmurs. Lungs: clear Abdomen: soft, nontender, nondistended. No hepatosplenomegaly. No bruits or masses. Good bowel sounds. Extremities: no cyanosis, clubbing, rash, edema Neuro: alert & oriented x 3, cranial nerves grossly intact. moves all 4 extremities w/o difficulty. Affect pleasant.  ECG: SR with bigeminal PACs, possible LAE, narrow Q waves inferolateral leads, non-specific ST-T inferolateral leads.  Results for orders placed or performed during the hospital encounter of 01/09/15 (from the past 24 hour(s))  CBC with Differential/Platelet     Status: None  Collection Time: 01/09/15  7:30 PM  Result Value Ref Range   WBC 8.7 4.0 - 10.5 K/uL   RBC 4.10 3.87 - 5.11 MIL/uL   Hemoglobin 12.8 12.0 - 15.0 g/dL   HCT 96.0 45.4 - 09.8 %   MCV 92.4  78.0 - 100.0 fL   MCH 31.2 26.0 - 34.0 pg   MCHC 33.8 30.0 - 36.0 g/dL   RDW 11.9 14.7 - 82.9 %   Platelets 275 150 - 400 K/uL   Neutrophils Relative % 65 43 - 77 %   Neutro Abs 5.7 1.7 - 7.7 K/uL   Lymphocytes Relative 22 12 - 46 %   Lymphs Abs 1.9 0.7 - 4.0 K/uL   Monocytes Relative 9 3 - 12 %   Monocytes Absolute 0.8 0.1 - 1.0 K/uL   Eosinophils Relative 3 0 - 5 %   Eosinophils Absolute 0.3 0.0 - 0.7 K/uL   Basophils Relative 1 0 - 1 %   Basophils Absolute 0.1 0.0 - 0.1 K/uL  Basic metabolic panel     Status: Abnormal   Collection Time: 01/09/15  7:30 PM  Result Value Ref Range   Sodium 141 135 - 145 mmol/L   Potassium 3.7 3.5 - 5.1 mmol/L   Chloride 108 96 - 112 mmol/L   CO2 24 19 - 32 mmol/L   Glucose, Bld 111 (H) 70 - 99 mg/dL   BUN 18 6 - 23 mg/dL   Creatinine, Ser 5.62 0.50 - 1.10 mg/dL   Calcium 9.3 8.4 - 13.0 mg/dL   GFR calc non Af Amer 82 (L) >90 mL/min   GFR calc Af Amer >90 >90 mL/min   Anion gap 9 5 - 15  Troponin I     Status: Abnormal   Collection Time: 01/09/15  7:30 PM  Result Value Ref Range   Troponin I 3.36 (HH) <0.031 ng/mL   Dg Chest 2 View  01/09/2015   CLINICAL DATA:  Chest pain 15 minutes prior to admission. Former smoker.  EXAM: CHEST  2 VIEW  COMPARISON:  None.  FINDINGS: Normal cardiac silhouette and mediastinal contours. Minimal bilateral infrahilar heterogeneous opacities favored to represent atelectasis. No discrete focal airspace opacities. There is minimal pleural parenchymal thickening about the bilateral major fissures. No pleural effusion or pneumothorax. No evidence of edema. No acute osseus abnormalities. Post cholecystectomy.  IMPRESSION: Minimal infrahilar atelectasis without acute cardiopulmonary disease.   Electronically Signed   By: Simonne Come M.D.   On: 01/09/2015 19:49     ASSESSMENT: 54 yo woman with h/o tobacco and recent inferior STEMI treated with PCI to RCA x 2 and unsuccessful attempt PCI of high grade obstructive Cx  disease in staged fashion resulting in dissection and inability to re-cross lesion into true lumen who now presents with chest pain. Her symptoms today are different than previous, however, there is some typical features and she has known disease.  It is reassuring that she is currently symptoms free with an unchanged ECG and troponin that continues to down-trend from recent admission.    PLAN/DISCUSSION: Observation Cycle troponins Continue ASA, ticagrelor, statin, BB Consider long acting nitrate if discomfort recurs Further work up pending above Continue risk factor modification

## 2015-01-13 ENCOUNTER — Encounter: Payer: 59 | Admitting: Nurse Practitioner

## 2015-01-16 ENCOUNTER — Encounter: Payer: Self-pay | Admitting: Physician Assistant

## 2015-01-16 DIAGNOSIS — F319 Bipolar disorder, unspecified: Secondary | ICD-10-CM | POA: Insufficient documentation

## 2015-01-16 DIAGNOSIS — K219 Gastro-esophageal reflux disease without esophagitis: Secondary | ICD-10-CM | POA: Insufficient documentation

## 2015-01-16 DIAGNOSIS — F32A Depression, unspecified: Secondary | ICD-10-CM | POA: Insufficient documentation

## 2015-01-16 DIAGNOSIS — F419 Anxiety disorder, unspecified: Secondary | ICD-10-CM | POA: Insufficient documentation

## 2015-01-16 DIAGNOSIS — F329 Major depressive disorder, single episode, unspecified: Secondary | ICD-10-CM | POA: Insufficient documentation

## 2015-01-16 NOTE — Progress Notes (Signed)
Cardiology Office Note Date:  01/17/2015  Patient ID:  Sara Trujillo, DOB 12-16-1960, MRN [id number] PCP:  No primary care provider on file.  Cardiologist:  M. Cooper   Chief Complaint: follow-up of recent MI  History of Present Illness: Sara Trujillo is a 54 y.o. female with history of tobacco abuse, CAD with recent inferior STEMI, HTN, hyperlipidemia, GERD, anxiety/depression and bipolar disorder who presents for post-hospital follow-up.  She was recently admitted 3/19-3/23 with chest pain, on no medications prior to admission. She was found to have STEMI treated with DES x 2 to distal and prox RCA. She was noted to have obstructive disease of the mCx and planned to have staged PCI. This was attempted prior to discharge but there was difficulty crossing the lesion and resulted in a dissection and attempt at re-crossing into the true lumen was unsuccessful. Of note, she also has moderate non-obstructive disease of LAD.2D Echo 01/06/15: EF 50-55%, probable mild HK of inferior myocardium, grade 1 DD, mild MR. She was discharged with standard MI therapy and did well until 3/25, when she had severe emotional upset related to her dog's illness (the dog passed away).In that setting, she developed severe c/p and presented to [facility] where ECG was non-acute but troponin was elevated.She was admitted for further observation and troponin continued to trend down, felt secondary to prior event rather than new ACS.  Lipids on 3/19 showed Tchol 236, trig 154, HDL 35, LDL 170. No available LFTs. During admission for STEMI had intermittent hypokalemia 3.0-3.3.  She presents back to clinic for follow-up and overall is doing well. She does notice that when her husband is yelling about something or she feels stressed/pressured, she gets a sensation of discomfort across her collarbones. This is different than prior angina. She has gradually increased her activity back and now includes low-level  walking. She has not had any chest pain with exertion. She has not had any dyspnea, diaphoresis, palpitations, LEE. Chest pain is not worse with inspiration, palpation or movement. It only seems to occur when she feels emotionally stressed. She has quit smoking and feels a bit on edge. 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 She was told at the hospital she could return yesterday but opted to take another day off to recover.  Past Medical History  Diagnosis Date  . CAD (coronary artery disease)     a. 12/2014 Inferior Inf STEMI/PCI: LM nl, LAD 60p, D1 50-60p, LCX nondom 6m, RI 80,m RCA dom, 100p (DES to prox and distal RCA), EF 60%;  b 12/2014 Staged attempted PCI of AV groove LCX with resultant dissection->aborted;  c. 12/2014 Echo: EF 50-55%, mod LVH, mild inf HK, Gr 1 DD, mild MR.  . Tobacco abuse   . Hypertension   . Hyperlipidemia   . GERD (gastroesophageal reflux disease)   . Daily headache   . Anxiety   . Depression   . Bipolar disorder     Past Surgical History  Procedure Laterality Date  . Left heart catheterization with coronary angiogram N/A 01/04/2015    Procedure: LEFT HEART CATHETERIZATION WITH CORONARY ANGIOGRAM;  Surgeon: Runell Gess, MD; LAD 60%, D1 60%, CFX 90%, RI 80%, RCA 100%>>0% w/  2.25 x 12 mm Xience DES, EF 60%    . Percutaneous coronary stent intervention (pci-s)  01/04/2015    Procedure: PERCUTANEOUS CORONARY STENT INTERVENTION (PCI-S);  Surgeon: Runell Gess, MD;  2.25 x 12 mm Xience  DES to RCA    . Percutaneous coronary stent intervention (pci-s) N/A 01/06/2015    Procedure: PERCUTANEOUS CORONARY STENT INTERVENTION (PCI-S);  Surgeon: Runell Gess, MD; RCA OK, CFX unsuccessfult PCI 2nd distal dissection   . Laparoscopic cholecystectomy  1990's    Current Outpatient Prescriptions  Medication Sig Dispense Refill  . aspirin 81 MG tablet Take 1 tablet (81 mg total) by mouth daily.    Marland Kitchen atorvastatin  (LIPITOR) 80 MG tablet Take 1 tablet (80 mg total) by mouth daily. 30 tablet 11  . lisinopril (PRINIVIL,ZESTRIL) 5 MG tablet Take 1 tablet (5 mg total) by mouth daily. 30 tablet 11  . LORazepam (ATIVAN) 1 MG tablet Take 1 mg by mouth every 8 (eight) hours.    . metoprolol tartrate (LOPRESSOR) 25 MG tablet Take 0.5 tablets (12.5 mg total) by mouth 2 (two) times daily. 60 tablet 11  . nitroGLYCERIN (NITROSTAT) 0.4 MG SL tablet Place 1 tablet (0.4 mg total) under the tongue every 5 (five) minutes as needed for chest pain. 25 tablet 3  . omeprazole (PRILOSEC OTC) 20 MG tablet Take 1 tablet (20 mg total) by mouth daily.    . ticagrelor (BRILINTA) 90 MG TABS tablet Take 1 tablet (90 mg total) by mouth 2 (two) times daily. 60 tablet 11   No current facility-administered medications for this visit.    Allergies:   Penicillins   Social History:  The patient  reports that she quit smoking about 1 weeks ago. Her smoking use included Cigarettes. She has a 21.75 pack-year smoking history. She has never used smokeless tobacco. She reports that she drinks alcohol. She reports that she does not use illicit drugs.   Family History:  The patient's family history includes Alzheimer's disease in her mother; Arrhythmia in her mother; COPD in her mother; Cancer - Colon in her father.  ROS:  Please see the history of present illness.  All other systems are reviewed and otherwise negative.   PHYSICAL EXAM:  VS:  BP 122/80 mmHg  Pulse 65  Ht  (1.702 m)  Wt 164 lb (74.39 kg)  BMI 25.68 kg/m2 BMI: Body mass index is 25.68 kg/(m^2). Well nourished, well developed F in no acute distress HEENT: normocephalic, atraumatic Neck: no JVD, no carotid bruit Cardiac:  normal S1, S2; RRR; no murmur Lungs:  clear to auscultation bilaterally, no wheezing, rhonchi or rales Abd: soft, nontender, no hepatomegaly Ext: no edema, right radial cath site without hematoma or ecchymosis - good radial pulse. Right groin with  minimal resolving ecchymosis but no hematoma or bruit. Skin: warm and dry Neuro:  moves all extremities spontaneously, no focal abnormalities noted  EKG:  NSR 65bpm, possible LAE, nonspecific TW abnormality, no acute change from prior  Recent Labs: 01/09/2015: BUN 18; Potassium 3.7; Sodium 141 01/10/2015: Creatinine 0.74; Hemoglobin 19.3*; Platelets 137*  01/04/2015: Cholesterol, Total 236*; HDL-C 35*; LDL (calc) 170*; Total CHOL/HDL Ratio 6.7; Triglycerides 154*; VLDL 31   Estimated Creatinine Clearance: 84.6 mL/min (by C-G formula based on Cr of 0.74).   Wt Readings from Last 3 Encounters:  01/17/15 164 lb (74.39 kg)  01/10/15 163 lb 3.2 oz (74.027 kg)  01/08/15 161 lb 4.8 oz (73.165 kg)     Other studies reviewed: Additional studies/records reviewed today include: summarized above  ASSESSMENT AND PLAN:  1. CAD with recent inferior STEMI s/p PCI with residual disease, subsequent encounter - doing well post-PCI. No problems getting meds. Continue ASA, BB, statin, Brilinta, ACEI. Will defer ultimate  duration of Brilinta to primary cardiologist. She does endorse some atypical chest discomfort when she is under stress but this is non-exertional and not associated with any other symptoms. It is different than her recent angina. EKG is stable. She did not experience this at all today walking from the parking lot to our clinic and has not had any discomfort with activity at home. I suspect related to stress. It resolves with the patient trying to relax and calm down. Will continue to monitor for now. However, I asked patient to please monitor for any increased symptoms with exertion at which point we may have to re-evaluate. She is agreeable. Warning signs reviewed. 2. Essential hypertension - controlled on present regimen. Recheck BMET given ACEI initiation. 3. Hyperlipidemia - no recent baseline LFTs available. Will obtain today along with BMET. F/u lipids/CMET in 6-8 weeks. 4. Tobacco abuse -  congratulated on cessation and reinforced importance of abstinence. 5. Recent hypokalemia - recheck labs today.  Disposition: F/u with Dr. Excell Seltzerooper 6-8 weeks.  Current medicines are reviewed at length with the patient today.  The patient did not have any concerns regarding medicines.  Signed, Ronie Spiesayna Dunn PA-C 01/17/2015 1:40 PM     [facility] [address] [place] [id number] [phone] (office)  [phone] (fax)

## 2015-01-17 ENCOUNTER — Ambulatory Visit (INDEPENDENT_AMBULATORY_CARE_PROVIDER_SITE_OTHER): Payer: 59 | Admitting: Physician Assistant

## 2015-01-17 ENCOUNTER — Encounter: Payer: Self-pay | Admitting: Physician Assistant

## 2015-01-17 VITALS — BP 122/80 | HR 65 | Ht 67.0 in | Wt 164.0 lb

## 2015-01-17 DIAGNOSIS — E876 Hypokalemia: Secondary | ICD-10-CM

## 2015-01-17 DIAGNOSIS — I10 Essential (primary) hypertension: Secondary | ICD-10-CM | POA: Diagnosis not present

## 2015-01-17 DIAGNOSIS — Z9861 Coronary angioplasty status: Secondary | ICD-10-CM

## 2015-01-17 DIAGNOSIS — I2510 Atherosclerotic heart disease of native coronary artery without angina pectoris: Secondary | ICD-10-CM

## 2015-01-17 DIAGNOSIS — Z720 Tobacco use: Secondary | ICD-10-CM

## 2015-01-17 DIAGNOSIS — E785 Hyperlipidemia, unspecified: Secondary | ICD-10-CM | POA: Diagnosis not present

## 2015-01-17 DIAGNOSIS — I2119 ST elevation (STEMI) myocardial infarction involving other coronary artery of inferior wall: Secondary | ICD-10-CM | POA: Diagnosis not present

## 2015-01-17 LAB — COMPREHENSIVE METABOLIC PANEL
ALBUMIN: 3.9 g/dL (ref 3.5–5.2)
ALK PHOS: 91 U/L (ref 39–117)
ALT: 16 U/L (ref 0–35)
AST: 14 U/L (ref 0–37)
BUN: 11 mg/dL (ref 6–23)
CALCIUM: 9.9 mg/dL (ref 8.4–10.5)
CHLORIDE: 105 meq/L (ref 96–112)
CO2: 27 mEq/L (ref 19–32)
CREATININE: 0.7 mg/dL (ref 0.40–1.20)
GFR: 92.55 mL/min (ref >60.00)
Glucose, Bld: 88 mg/dL (ref 70–99)
Potassium: 3.6 mEq/L (ref 3.5–5.1)
Sodium: 138 mEq/L (ref 135–145)
Total Bilirubin: 0.6 mg/dL (ref 0.2–1.2)
Total Protein: 7.4 g/dL (ref 6.0–8.3)

## 2015-01-17 NOTE — Patient Instructions (Addendum)
I am so sorry to hear of the loss of your dog.   LAB WORK TODAY; CMET  YOU WILL NEED LAB WORK IN 6-8 WEEKS FOR A FASTING LIPID PANEL AND CMET  FOLLOW UP WITH DT. COOPER IN 6-8 WEEKS

## 2015-01-21 ENCOUNTER — Telehealth: Payer: Self-pay

## 2015-01-21 NOTE — Telephone Encounter (Signed)
Sara Trujillo at 01/21/2015 8:41 AM     Status: Signed       Expand All Collapse All   Pt has 3 stents put in and has had pain in her collar bone area and some chest pain, is not getting any better-pls advise           Initial phone call was in the wrong chart.

## 2015-01-21 NOTE — Telephone Encounter (Signed)
Probably ok to observe for now. If symptoms worsen she should come in for evaluation. Also can try sublingual NTG as needed.  Sara BollmanMichael Sara Trujillo 01/21/2015 3:28 PM

## 2015-01-21 NOTE — Telephone Encounter (Signed)
I spoke with the pt and she called the office to make us aware that she continues to have discomfort in her collar bones and chest that come and go. The pt's first day back at work was yesterday and she exerted herself but did not experience any symptoms.  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  These are the same symptoms that the pt discussed during 01/17/15 appointment. The pt decided to work from home today due to her symptoms. I will forward this information to Dr Excell Seltzerooper for review.

## 2015-01-21 NOTE — Telephone Encounter (Signed)
I spoke with the pt and made her aware of Dr Earmon Phoenixooper's recommendation. I also scheduled the pt's next follow-up appointment in May.

## 2015-01-22 ENCOUNTER — Telehealth: Payer: Self-pay | Admitting: *Deleted

## 2015-01-22 NOTE — Telephone Encounter (Signed)
pt's husband notified of normal lab results

## 2015-01-25 ENCOUNTER — Emergency Department (HOSPITAL_BASED_OUTPATIENT_CLINIC_OR_DEPARTMENT_OTHER)
Admission: EM | Admit: 2015-01-25 | Discharge: 2015-01-25 | Disposition: A | Discharge status: Home / Self Care | Payer: 59 | Attending: Emergency Medicine | Admitting: Emergency Medicine

## 2015-01-25 ENCOUNTER — Emergency Department (HOSPITAL_BASED_OUTPATIENT_CLINIC_OR_DEPARTMENT_OTHER): Payer: 59

## 2015-01-25 ENCOUNTER — Encounter (HOSPITAL_BASED_OUTPATIENT_CLINIC_OR_DEPARTMENT_OTHER): Payer: Self-pay

## 2015-01-25 DIAGNOSIS — Z880 Allergy status to penicillin: Secondary | ICD-10-CM | POA: Diagnosis not present

## 2015-01-25 DIAGNOSIS — E785 Hyperlipidemia, unspecified: Secondary | ICD-10-CM | POA: Diagnosis not present

## 2015-01-25 DIAGNOSIS — F419 Anxiety disorder, unspecified: Secondary | ICD-10-CM | POA: Insufficient documentation

## 2015-01-25 DIAGNOSIS — Z7982 Long term (current) use of aspirin: Secondary | ICD-10-CM | POA: Diagnosis not present

## 2015-01-25 DIAGNOSIS — K219 Gastro-esophageal reflux disease without esophagitis: Secondary | ICD-10-CM | POA: Insufficient documentation

## 2015-01-25 DIAGNOSIS — I2510 Atherosclerotic heart disease of native coronary artery without angina pectoris: Secondary | ICD-10-CM | POA: Diagnosis not present

## 2015-01-25 DIAGNOSIS — Z87891 Personal history of nicotine dependence: Secondary | ICD-10-CM | POA: Diagnosis not present

## 2015-01-25 DIAGNOSIS — Z79899 Other long term (current) drug therapy: Secondary | ICD-10-CM | POA: Insufficient documentation

## 2015-01-25 DIAGNOSIS — F319 Bipolar disorder, unspecified: Secondary | ICD-10-CM | POA: Insufficient documentation

## 2015-01-25 DIAGNOSIS — R0789 Other chest pain: Secondary | ICD-10-CM | POA: Diagnosis not present

## 2015-01-25 DIAGNOSIS — I10 Essential (primary) hypertension: Secondary | ICD-10-CM | POA: Diagnosis not present

## 2015-01-25 DIAGNOSIS — R079 Chest pain, unspecified: Secondary | ICD-10-CM | POA: Diagnosis present

## 2015-01-25 LAB — CBC
HEMATOCRIT: 41.1 % (ref 36.0–46.0)
Hemoglobin: 13.6 g/dL (ref 12.0–15.0)
MCH: 30.8 pg (ref 26.0–34.0)
MCHC: 33.1 g/dL (ref 30.0–36.0)
MCV: 93 fL (ref 78.0–100.0)
Platelets: 352 10*3/uL (ref 150–400)
RBC: 4.42 MIL/uL (ref 3.87–5.11)
RDW: 13.1 % (ref 11.5–15.5)
WBC: 9.7 10*3/uL (ref 4.0–10.5)

## 2015-01-25 LAB — BASIC METABOLIC PANEL
ANION GAP: 12 (ref 5–15)
BUN: 12 mg/dL (ref 6–23)
CO2: 21 mmol/L (ref 19–32)
CREATININE: 0.69 mg/dL (ref 0.50–1.10)
Calcium: 9.5 mg/dL (ref 8.4–10.5)
Chloride: 107 mmol/L (ref 96–112)
GLUCOSE: 99 mg/dL (ref 70–99)
Potassium: 3.9 mmol/L (ref 3.5–5.1)
Sodium: 140 mmol/L (ref 135–145)

## 2015-01-25 LAB — TROPONIN I
TROPONIN I: 0.03 ng/mL (ref <0.031)
Troponin I: 0.03 ng/mL (ref <0.031)

## 2015-01-25 MED ORDER — MORPHINE SULFATE 4 MG/ML IJ SOLN
4.0000 mg | Freq: Once | INTRAMUSCULAR | Status: AC
  Administered 2015-01-25: 4 mg via INTRAVENOUS
  Filled 2015-01-25: qty 1

## 2015-01-25 MED ORDER — NAPROXEN 250 MG PO TABS
500.0000 mg | ORAL_TABLET | Freq: Once | ORAL | Status: AC
  Administered 2015-01-25: 500 mg via ORAL
  Filled 2015-01-25: qty 2

## 2015-01-25 NOTE — Discharge Instructions (Signed)
Chest Pain (Nonspecific) °It is often hard to give a specific diagnosis for the cause of chest pain. There is always a chance that your pain could be related to something serious, such as a heart attack or a blood clot in the lungs. You need to follow up with your health care provider for further evaluation. °CAUSES  °· Heartburn. °· Pneumonia or bronchitis. °· Anxiety or stress. °· Inflammation around your heart (pericarditis) or lung (pleuritis or pleurisy). °· A blood clot in the lung. °· A collapsed lung (pneumothorax). It can develop suddenly on its own (spontaneous pneumothorax) or from trauma to the chest. °· Shingles infection (herpes zoster virus). °The chest wall is composed of bones, muscles, and cartilage. Any of these can be the source of the pain. °· The bones can be bruised by injury. °· The muscles or cartilage can be strained by coughing or overwork. °· The cartilage can be affected by inflammation and become sore (costochondritis). °DIAGNOSIS  °Lab tests or other studies may be needed to find the cause of your pain. Your health care provider may have you take a test called an ambulatory electrocardiogram (ECG). An ECG records your heartbeat patterns over a 24-hour period. You may also have other tests, such as: °· Transthoracic echocardiogram (TTE). During echocardiography, sound waves are used to evaluate how blood flows through your heart. °· Transesophageal echocardiogram (TEE). °· Cardiac monitoring. This allows your health care provider to monitor your heart rate and rhythm in real time. °· Holter monitor. This is a portable device that records your heartbeat and can help diagnose heart arrhythmias. It allows your health care provider to track your heart activity for several days, if needed. °· Stress tests by exercise or by giving medicine that makes the heart beat faster. °TREATMENT  °· Treatment depends on what may be causing your chest pain. Treatment may include: °· Acid blockers for  heartburn. °· Anti-inflammatory medicine. °· Pain medicine for inflammatory conditions. °· Antibiotics if an infection is present. °· You may be advised to change lifestyle habits. This includes stopping smoking and avoiding alcohol, caffeine, and chocolate. °· You may be advised to keep your head raised (elevated) when sleeping. This reduces the chance of acid going backward from your stomach into your esophagus. °Most of the time, nonspecific chest pain will improve within 2-3 days with rest and mild pain medicine.  °HOME CARE INSTRUCTIONS  °· If antibiotics were prescribed, take them as directed. Finish them even if you start to feel better. °· For the next few days, avoid physical activities that bring on chest pain. Continue physical activities as directed. °· Do not use any tobacco products, including cigarettes, chewing tobacco, or electronic cigarettes. °· Avoid drinking alcohol. °· Only take medicine as directed by your health care provider. °· Follow your health care provider's suggestions for further testing if your chest pain does not go away. °· Keep any follow-up appointments you made. If you do not go to an appointment, you could develop lasting (chronic) problems with pain. If there is any problem keeping an appointment, call to reschedule. °SEEK MEDICAL CARE IF:  °· Your chest pain does not go away, even after treatment. °· You have a rash with blisters on your chest. °· You have a fever. °SEEK IMMEDIATE MEDICAL CARE IF:  °· You have increased chest pain or pain that spreads to your arm, neck, jaw, back, or abdomen. °· You have shortness of breath. °· You have an increasing cough, or you cough   up blood. °· You have severe back or abdominal pain. °· You feel nauseous or vomit. °· You have severe weakness. °· You faint. °· You have chills. °This is an emergency. Do not wait to see if the pain will go away. Get medical help at once. Call your local emergency services (911 in [place] Do not drive  yourself to the hospital. °MAKE SURE YOU:  °· Understand these instructions. °· Will watch your condition. °· Will get help right away if you are not doing well or get worse. °Document Released: 07/14/2005 Document Revised: 10/09/2013 Document Reviewed: 05/09/2008 °Exit[employer]® Patient Information ©2015 [employer]. This information is not intended to replace advice given to you by your health care provider. Make sure you discuss any questions you have with your health care provider. ° ° °Angina Pectoris °Angina pectoris, often just called angina, is extreme discomfort in your chest, neck, or arm caused by a lack of blood in the middle and thickest layer of your heart wall (myocardium). It may feel like tightness or heavy pressure. It may feel like a crushing or squeezing pain. Some people say it feels like gas or indigestion. It may go down your shoulders, back, and arms. Some people may have symptoms other than pain. These symptoms include fatigue, shortness of breath, cold sweats, or nausea. There are four different types of angina: °· Stable angina--Stable angina usually occurs in episodes of predictable frequency and duration. It usually is brought on by physical activity, emotional stress, or excitement. These are all times when the myocardium needs more oxygen. Stable angina usually lasts a few minutes and often is relieved by taking a medicine that can be taken under your tongue (sublingually). The medicine is called nitroglycerin. Stable angina is caused by a buildup of plaque inside the arteries, which restricts blood flow to the heart muscle (atherosclerosis). °· Unstable angina--Unstable angina can occur even when your body experiences little or no physical exertion. It can occur during sleep. It can also occur at rest. It can suddenly increase in severity or frequency. It might not be relieved by sublingual nitroglycerin. It can last up to 30 minutes. The most common cause of unstable angina is a blood  clot that has developed on the top of plaque buildup inside a coronary artery. It can lead to a heart attack if the blood clot completely blocks the artery. °· Microvascular angina--This type of angina is caused by a disorder of tiny blood vessels called arterioles. Microvascular angina is more common in women. The pain may be more severe and last longer than other types of angina pectoris. °· Prinzmetal or variant angina--This type of angina pectoris usually occurs when your body experiences little or no physical exertion. It especially occurs in the early morning hours. It is caused by a spasm of your coronary artery. °HOME CARE INSTRUCTIONS  °· Only take over-the-counter and prescription medicines as directed by your health care provider. °· Stay active or increase your exercise as directed by your health care provider. °· Limit strenuous activity as directed by your health care provider. °· Limit heavy lifting as directed by your health care provider. °· Maintain a healthy weight. °· Learn about and eat heart-healthy foods. °· Do not use any tobacco products including cigarettes, chewing tobacco or electronic cigarettes. °SEEK IMMEDIATE MEDICAL CARE IF:  °You experience the following symptoms: °· Chest, neck, deep shoulder, or arm pain or discomfort that lasts more than a few minutes. °· Chest, neck, deep shoulder, or arm pain   or discomfort that goes away and comes back, repeatedly. °· Heavy sweating with discomfort, without a noticeable cause. °· Shortness of breath or difficulty breathing. °· Angina that does not get better after a few minutes of rest or after taking sublingual nitroglycerin. °These can all be symptoms of a heart attack, which is a medical emergency! Get medical help at once. Call your local emergency service (911 in [place] immediately. Do not  drive yourself to the hospital and do not  wait to for your symptoms to go away. °MAKE SURE YOU: °· Understand these instructions. °· Will watch your  condition. °· Will get help right away if you are not doing well or get worse. °Document Released: 10/04/2005 Document Revised: 10/09/2013 Document Reviewed: 02/05/2014 °ExitCare® Patient Information ©2015 [employer]. This information is not intended to replace advice given to you by your health care provider. Make sure you discuss any questions you have with your health care provider. ° °

## 2015-01-25 NOTE — ED Notes (Signed)
IV attempts x2 unsuccessful.  

## 2015-01-25 NOTE — ED Notes (Signed)
Pt reports recurrent chest heaviness that began today, pt admits to having variable chest pain x3 weeks since pt experienced an acute MI and had cardiac stents placed. Pt denies shortness of breath, n/v or dizziness, states her symptoms are not similar to her symptoms that occurred during her MI.

## 2015-01-25 NOTE — ED Provider Notes (Signed)
CSN: [id number]     Arrival date & time 01/25/15  1354 History   First MD Initiated Contact with Patient 01/25/15 1439     Chief Complaint  Patient presents with  . Chest Pain     (Consider location/radiation/quality/duration/timing/severity/associated sxs/prior Treatment) HPI Comments: 54 year old female presenting with midsternal, non-radiating chest pressure, tightness and heaviness beginning around 9:00 AM this morning when she was sitting down doing nothing in specific. She had 3 cardiac stents placed on 01/04/2015 after STEMI. At the onset of chest pain, she took 3 sublingual nitroglycerin, 5 minutes apart, with temporary relief for one hour of her symptoms, which gradually returned. This pain is not as severe as when she had the STEMI. 5-6 days ago, she had a phone consult with the cardiologist nurse, who at that time suggested she go to the hospital for observation, however not do so. Denies shortness of breath, nausea, vomiting, diaphoresis.  Patient is a 54 y.o. female presenting with chest pain. The history is provided by the patient and medical records.  Chest Pain   Past Medical History  Diagnosis Date  . CAD (coronary artery disease)     a. 12/2014 Inferior Inf STEMI/PCI: LM nl, LAD 60p, D1 50-60p, LCX nondom 65m, RI 80,m RCA dom, 100p (DES to prox and distal RCA), EF 60%;  b 12/2014 Staged attempted PCI of AV groove LCX with resultant dissection->aborted;  c. 12/2014 Echo: EF 50-55%, mod LVH, mild inf HK, Gr 1 DD, mild MR.  . Tobacco abuse   . Hypertension   . Hyperlipidemia   . GERD (gastroesophageal reflux disease)   . Daily headache   . Anxiety   . Depression   . Bipolar disorder    Past Surgical History  Procedure Laterality Date  . Left heart catheterization with coronary angiogram N/A 01/04/2015    Procedure: LEFT HEART CATHETERIZATION WITH CORONARY ANGIOGRAM;  Surgeon: Runell Gess, MD; LAD 60%, D1 60%, CFX 90%, RI 80%, RCA 100%>>0% w/  2.25 x 12 mm Xience DES, EF  60%    . Percutaneous coronary stent intervention (pci-s)  01/04/2015    Procedure: PERCUTANEOUS CORONARY STENT INTERVENTION (PCI-S);  Surgeon: Runell Gess, MD;  2.25 x 12 mm Xience DES to RCA    . Percutaneous coronary stent intervention (pci-s) N/A 01/06/2015    Procedure: PERCUTANEOUS CORONARY STENT INTERVENTION (PCI-S);  Surgeon: Runell Gess, MD; RCA OK, CFX unsuccessfult PCI 2nd distal dissection   . Laparoscopic cholecystectomy  1990's   Family History  Problem Relation Age of Onset  . COPD Mother   . Arrhythmia Mother   . Alzheimer's disease Mother   . Cancer - Colon Father    History  Substance Use Topics  . Smoking status: Former Smoker -- 0.75 packs/day for 29 years    Types: Cigarettes    Quit date: 01/04/2015  . Smokeless tobacco: Never Used  . Alcohol Use: 0.0 oz/week    0 Standard drinks or equivalent per week     Comment: 01/07/2015 "might have a drink a few times/yr"   OB History    No data available     Review of Systems  Respiratory: Positive for chest tightness.   Cardiovascular: Positive for chest pain.  All other systems reviewed and are negative.     Allergies  Penicillins  Home Medications   Prior to Admission medications   Medication Sig Start Date End Date Taking? Authorizing Provider  aspirin 81 MG tablet Take 1 tablet (81 mg total) by  mouth daily. 01/08/15   Rhonda G Barrett, PA-C  atorvastatin (LIPITOR) 80 MG tablet Take 1 tablet (80 mg total) by mouth daily. 01/08/15   Rhonda G Barrett, PA-C  lisinopril (PRINIVIL,ZESTRIL) 5 MG tablet Take 1 tablet (5 mg total) by mouth daily. 01/08/15   Rhonda G Barrett, PA-C  LORazepam (ATIVAN) 1 MG tablet Take 1 mg by mouth every 8 (eight) hours.    Historical Provider, MD  metoprolol tartrate (LOPRESSOR) 25 MG tablet Take 0.5 tablets (12.5 mg total) by mouth 2 (two) times daily. 01/08/15   Rhonda G Barrett, PA-C  nitroGLYCERIN (NITROSTAT) 0.4 MG SL tablet Place 1 tablet (0.4 mg total) under the tongue  every 5 (five) minutes as needed for chest pain. 01/08/15   Rhonda G Barrett, PA-C  omeprazole (PRILOSEC OTC) 20 MG tablet Take 1 tablet (20 mg total) by mouth daily. 01/10/15   Ok Anis, NP  ticagrelor (BRILINTA) 90 MG TABS tablet Take 1 tablet (90 mg total) by mouth 2 (two) times daily. 01/08/15   Rhonda G Barrett, PA-C   BP 140/82 mmHg  Pulse 59  Temp(Src) 99.3 F (37.4 C) (Oral)  Resp 13  Ht  (1.702 m)  Wt 163 lb (73.936 kg)  BMI 25.52 kg/m2  SpO2 100% Physical Exam  Constitutional: She is oriented to person, place, and time. She appears well-developed and well-nourished. No distress.  HENT:  Head: Normocephalic and atraumatic.  Mouth/Throat: Oropharynx is clear and moist.  Eyes: Conjunctivae and EOM are normal. Pupils are equal, round, and reactive to light.  Neck: Normal range of motion. Neck supple. No JVD present.  Cardiovascular: Normal rate, regular rhythm, normal heart sounds and intact distal pulses.   No extremity edema.  Pulmonary/Chest: Effort normal and breath sounds normal. No respiratory distress.  Abdominal: Soft. Bowel sounds are normal. There is no tenderness.  Musculoskeletal: Normal range of motion. She exhibits no edema.  Neurological: She is alert and oriented to person, place, and time. She has normal strength. No sensory deficit.  Speech fluent, goal oriented. Moves limbs without ataxia. Equal grip strength bilateral.  Skin: Skin is warm and dry. She is not diaphoretic.  Psychiatric: She has a normal mood and affect. Her behavior is normal.  Nursing note and vitals reviewed.   ED Course  Procedures (including critical care time) Labs Review Labs Reviewed  CBC  BASIC METABOLIC PANEL  TROPONIN I  TROPONIN I    Imaging Review Dg Chest 2 View  01/25/2015   CLINICAL DATA:  Central chest pain  EXAM: CHEST  2 VIEW  COMPARISON:  01/09/2015  FINDINGS: Cardiomediastinal silhouette is stable. No acute infiltrate or pleural effusion. No  pulmonary edema. Bony thorax is unremarkable.  IMPRESSION: No active cardiopulmonary disease.   Electronically Signed   By: Natasha Mead M.D.   On: 01/25/2015 15:03     EKG Interpretation   Date/Time:  Saturday January 25 2015 13:59:41 EDT Ventricular Rate:  76 PR Interval:  134 QRS Duration: 86 QT Interval:  394 QTC Calculation: 443 R Axis:   63 Text Interpretation:  Normal sinus rhythm Normal ECG since last tracing no  significant change Confirmed by BELFI  MD, MELANIE ([id number]) on 01/25/2015  2:15:16 PM      MDM   Final diagnoses:  Midsternal chest pain   Patient with recent STEMI and stent placement. Nontoxic appearing, NAD. Hypertensive at 182/90, vitals otherwise stable. Plan to obtain cardiac workup and consult cardiology.  Workup negative. I spoke with Dr.  McLean with cardiology who recommends 3-4 hours troponin. If negative, can d/c home.  Four hour trop negative. Pain resolved with morphine and naproxen. Vitals remain stable. Stable for discharge. Advised follow-up with cardiology as soon as possible. Return precautions given. Patient states understanding of treatment care plan and is agreeable.  Discussed with attending Dr. Fredderick PhenixBelfi who agrees with plan of care.   Kathrynn SpeedRobyn M Armend Hochstatter, PA-C 01/25/15 1921  Rolan BuccoMelanie Belfi, MD 01/26/15 [phone]

## 2015-01-25 NOTE — ED Notes (Signed)
Pt reports this morning had "pressure" in central chest area, describes as "heaviness".  States not worsened by exertion.  Denies sob, diaphoresis or nausea.  Constant in nature.  Hx of mi with 3 stents placed 3 wks ago.

## 2015-02-25 ENCOUNTER — Encounter: Payer: Self-pay | Admitting: Cardiology

## 2015-02-25 ENCOUNTER — Ambulatory Visit (INDEPENDENT_AMBULATORY_CARE_PROVIDER_SITE_OTHER): Payer: 59 | Admitting: Cardiology

## 2015-02-25 VITALS — BP 170/90 | HR 84 | Ht 67.0 in | Wt 169.8 lb

## 2015-02-25 DIAGNOSIS — I2510 Atherosclerotic heart disease of native coronary artery without angina pectoris: Secondary | ICD-10-CM

## 2015-02-25 DIAGNOSIS — I2119 ST elevation (STEMI) myocardial infarction involving other coronary artery of inferior wall: Secondary | ICD-10-CM | POA: Diagnosis not present

## 2015-02-25 DIAGNOSIS — Z720 Tobacco use: Secondary | ICD-10-CM | POA: Diagnosis not present

## 2015-02-25 DIAGNOSIS — F419 Anxiety disorder, unspecified: Secondary | ICD-10-CM

## 2015-02-25 DIAGNOSIS — Z9861 Coronary angioplasty status: Secondary | ICD-10-CM

## 2015-02-25 DIAGNOSIS — F3170 Bipolar disorder, currently in remission, most recent episode unspecified: Secondary | ICD-10-CM

## 2015-02-25 DIAGNOSIS — I10 Essential (primary) hypertension: Secondary | ICD-10-CM

## 2015-02-25 DIAGNOSIS — R0989 Other specified symptoms and signs involving the circulatory and respiratory systems: Secondary | ICD-10-CM

## 2015-02-25 DIAGNOSIS — E785 Hyperlipidemia, unspecified: Secondary | ICD-10-CM

## 2015-02-25 MED ORDER — TICAGRELOR 90 MG PO TABS: 90.0000 mg | ORAL_TABLET | Freq: Two times a day (BID) | ORAL | Status: DC

## 2015-02-25 MED ORDER — METOPROLOL TARTRATE 25 MG PO TABS: 12.5000 mg | ORAL_TABLET | Freq: Two times a day (BID) | ORAL | Status: DC

## 2015-02-25 MED ORDER — LISINOPRIL 10 MG PO TABS: 10.0000 mg | ORAL_TABLET | Freq: Every day | ORAL | Status: DC

## 2015-02-25 MED ORDER — ATORVASTATIN CALCIUM 80 MG PO TABS
80.0000 mg | ORAL_TABLET | Freq: Every day | ORAL | Status: DC
Start: 2015-02-25 — End: 2015-11-19

## 2015-02-25 NOTE — Assessment & Plan Note (Signed)
RCA DES, unsuccessful CFX PCI. She has been re admitted once and seen in the ED once since for chest pain.

## 2015-02-25 NOTE — Patient Instructions (Addendum)
Medication Instructions:   START TAKING LISINOPRIL 10 MG ONCE A DAY   Labwork:  FASTING LIPIDS AND CMET   Testing/Procedures:   Follow-Up:  IN 3 MONTHS WITH DR Excell SeltzerOOPER   Any Other Special Instructions Will Be Listed Below (If Applicable).   CALL WHEN YOU ARE READY FOR CAROTID DUPLEX PROCEDURE TO BE SCHEDULED

## 2015-02-25 NOTE — Assessment & Plan Note (Signed)
Needs lipids and a Cmet

## 2015-02-25 NOTE — Assessment & Plan Note (Signed)
She has been followed by psychiatry

## 2015-02-25 NOTE — Assessment & Plan Note (Signed)
Significant situational stress at home, husband dying of cancer

## 2015-02-25 NOTE — Assessment & Plan Note (Signed)
B/P 168/92 by me. Increase Lisinopril to 10 mg daily

## 2015-02-25 NOTE — Assessment & Plan Note (Signed)
She reports she has not smoked

## 2015-02-25 NOTE — Assessment & Plan Note (Signed)
She will contact us when she is ready to get dopplers

## 2015-02-25 NOTE — Progress Notes (Signed)
02/25/2015 Sara MccreedyMelinda Trujillo   Apr 25, 1961  [id number]  Primary Physician No PCP Per Patient Primary Cardiologist: Dr Sara Trujillo  HPI:  54 y/o female was admitted 3/19-3/23 with an inferior STEMI treated with DES x 2 to distal and prox RCA.She was noted to have obstructive disease of the mCx and planned to have staged PCI. This was attempted 01/06/15 but there was difficulty crossing the lesion and resulted in a dissection and attempt at re-crossing into the true lumen was unsuccessful. Of note, she also has moderate non-obstructive disease of LAD.A 2D Echo 01/06/15: EF 50-55%, probable mild HK of inferior myocardium, grade 1 DD, mild MR. She was discharged with standard MI therapy and did well until 3/25, when she had severe emotional upset related to her dog's illness (the dog passed away).In that setting, she developed severe c/p and [facility] where ECG was non-acute but troponin was elevated. Her Troponin with her MI was 59, down to 9 at discharge. It was 3.25 on 01/10/15 and falling to 2 by discharge the next day.She was then seen if f/u 01/17/15 by Sara Trujillo and was doing OK. Lots of stress at home- "my husband is dying of cancer". She again presented to the ED 01/25/15 with chest discomfort "my chest just doesn't feel right". Her Troponin was negative then and she was re assured. She is in the office today for follow up. She continues to have vague chest discomfort, worse in the evening and worse with emotional stress. Her symptoms are not similar to her MI symptoms.    Current Outpatient Prescriptions  Medication Sig Dispense Refill  . aspirin 81 MG tablet Take 1 tablet (81 mg total) by mouth daily.    Marland Kitchen. atorvastatin (LIPITOR) 80 MG tablet Take 1 tablet (80 mg total) by mouth daily. 30 tablet 11  . lisinopril (PRINIVIL,ZESTRIL) 5 MG tablet Take 1 tablet (5 mg total) by mouth daily. 30 tablet 11  . LORazepam (ATIVAN) 1 MG tablet Take 1 mg by mouth every 8 (eight) hours.    .  metoprolol tartrate (LOPRESSOR) 25 MG tablet Take 0.5 tablets (12.5 mg total) by mouth 2 (two) times daily. 60 tablet 11  . nitroGLYCERIN (NITROSTAT) 0.4 MG SL tablet Place 1 tablet (0.4 mg total) under the tongue every 5 (five) minutes as needed for chest pain. 25 tablet 3  . ticagrelor (BRILINTA) 90 MG TABS tablet Take 1 tablet (90 mg total) by mouth 2 (two) times daily. 60 tablet 11   No current facility-administered medications for this visit.    Allergies  Allergen Reactions  . Penicillins     History   Social History  . Marital Status: Married    Spouse Name: N/A  . Number of Children: N/A  . Years of Education: N/A   Occupational History  . Not on file.   Social History Main Topics  . Smoking status: Former Smoker -- 0.75 packs/day for 29 years    Types: Cigarettes    Quit date: 01/04/2015  . Smokeless tobacco: Never Used  . Alcohol Use: 0.0 oz/week    0 Standard drinks or equivalent per week     Comment: 01/07/2015 "might have a drink a few times/yr"  . Drug Use: No  . Sexual Activity: No   Other Topics Concern  . Not on file   Social History Narrative     Review of Systems: General: negative for chills, fever, night sweats or weight changes.  Cardiovascular: negative for chest pain, dyspnea on  exertion, edema, orthopnea, palpitations, paroxysmal nocturnal dyspnea or shortness of breath Dermatological: negative for rash Respiratory: negative for cough or wheezing Urologic: negative for hematuria Abdominal: negative for nausea, vomiting, diarrhea, bright red blood per rectum, melena, or hematemesis Neurologic: negative for visual changes, syncope, or dizziness All other systems reviewed and are otherwise negative except as noted above.    Blood pressure 170/90, pulse 84, height 5\' 7"  (1.702 m), weight 169 lb 12.8 oz (77.021 kg).  General appearance: alert, cooperative and no distress Neck: no JVD and Lt carotid bruit Lungs: clear to auscultation  bilaterally Heart: regular rate and rhythm Extremities: extremities normal, atraumatic, no cyanosis or edema Skin: Skin color, texture, turgor normal. No rashes or lesions Neurologic: Grossly normal  EKG NSR  ASSESSMENT AND PLAN:   STEMI of inferior wall- 01/04/15 RCA DES, unsuccessful CFX PCI. She has been re admitted once and seen in the ED once since for chest pain.   CAD S/P RCA DES x 2 01/04/15,  unsucessful CFX PCI 01/06/15 Echo showed EF 50-55% 01/06/15   Tobacco abuse She reports she has not smoked   Anxiety Significant situational stress at home, husband dying of cancer   Bipolar disorder She has been followed by psychiatry   Dyslipidemia Needs lipids and a Cmet   Essential hypertension B/P 168/92 by me. Increase Lisinopril to 10 mg daily   Carotid bruit She will contact us when she is ready to get dopplers    PLAN  She is due for lipids and a Cmet. I also heard a Lt carotid bruit. She'll contact us when she is ready to get her dopplers done, I told her it was not urgent. She should see Dr Sara Trujillo in 3 months. Her Lisinopril was increased, other meds refilled except her Ativan, I suggested she call her psychiatrist about this.   Sara Trujillo KPA-C 02/25/2015 3:40 PM

## 2015-02-25 NOTE — Assessment & Plan Note (Signed)
Echo showed EF 50-55% 01/06/15

## 2015-03-14 ENCOUNTER — Ambulatory Visit: Payer: 59 | Admitting: Cardiovascular Disease

## 2015-03-25 ENCOUNTER — Other Ambulatory Visit: Payer: 59

## 2015-04-01 ENCOUNTER — Other Ambulatory Visit: Payer: 59

## 2015-05-01 ENCOUNTER — Other Ambulatory Visit: Payer: 59

## 2015-06-30 ENCOUNTER — Ambulatory Visit: Payer: 59 | Admitting: Cardiovascular Disease

## 2015-07-31 ENCOUNTER — Ambulatory Visit: Payer: 59 | Admitting: Nurse Practitioner

## 2015-11-19 ENCOUNTER — Encounter: Payer: Self-pay | Admitting: Nurse Practitioner

## 2015-11-19 ENCOUNTER — Ambulatory Visit (INDEPENDENT_AMBULATORY_CARE_PROVIDER_SITE_OTHER): Payer: Managed Care, Other (non HMO) | Admitting: Nurse Practitioner

## 2015-11-19 VITALS — BP 150/90 | HR 58 | Ht 67.0 in | Wt 157.8 lb

## 2015-11-19 DIAGNOSIS — I2119 ST elevation (STEMI) myocardial infarction involving other coronary artery of inferior wall: Secondary | ICD-10-CM | POA: Diagnosis not present

## 2015-11-19 DIAGNOSIS — I2510 Atherosclerotic heart disease of native coronary artery without angina pectoris: Secondary | ICD-10-CM

## 2015-11-19 DIAGNOSIS — R0989 Other specified symptoms and signs involving the circulatory and respiratory systems: Secondary | ICD-10-CM

## 2015-11-19 DIAGNOSIS — Z720 Tobacco use: Secondary | ICD-10-CM | POA: Diagnosis not present

## 2015-11-19 DIAGNOSIS — E785 Hyperlipidemia, unspecified: Secondary | ICD-10-CM | POA: Diagnosis not present

## 2015-11-19 DIAGNOSIS — I10 Essential (primary) hypertension: Secondary | ICD-10-CM

## 2015-11-19 DIAGNOSIS — Z9861 Coronary angioplasty status: Secondary | ICD-10-CM

## 2015-11-19 MED ORDER — METOPROLOL TARTRATE 25 MG PO TABS: 12.5000 mg | ORAL_TABLET | Freq: Two times a day (BID) | ORAL | Status: DC

## 2015-11-19 MED ORDER — ATORVASTATIN CALCIUM 80 MG PO TABS: 80.0000 mg | ORAL_TABLET | Freq: Every day | ORAL | Status: DC

## 2015-11-19 MED ORDER — LISINOPRIL 20 MG PO TABS: 20.0000 mg | ORAL_TABLET | Freq: Every day | ORAL | Status: DC

## 2015-11-19 MED ORDER — TICAGRELOR 90 MG PO TABS: 90.0000 mg | ORAL_TABLET | Freq: Two times a day (BID) | ORAL | Status: DC

## 2015-11-19 MED ORDER — LISINOPRIL 10 MG PO TABS: 10.0000 mg | ORAL_TABLET | Freq: Every day | ORAL | Status: DC

## 2015-11-19 NOTE — Patient Instructions (Addendum)
We will be checking the following labs today - NONE  Fasting labs on return   Medication Instructions:    Continue with your current medicines. BUT  I am increasing the Lisinopril to 20 mg each day for your blood pressure  I have sent a 30 day supply of your medicines to [facility] and the [facility] days to [facility].     Testing/Procedures To Be Arranged:  N/A  Follow-Up:   See me in about 4 weeks with fasting labs.     Other Special Instructions:   Try to work on your smoking.     If you need a refill on your cardiac medications before your next appointment, please call your pharmacy.   Call the [facility] office at [phone] if you have any questions, problems or concerns.

## 2015-11-19 NOTE — Progress Notes (Signed)
CARDIOLOGY OFFICE NOTE  Date:  11/19/2015    Sara Trujillo Date of Birth: 1961/01/07 Medical Record #[id number]  PCP:  No PCP Per Patient  Cardiologist:  Excell Seltzer    Chief Complaint  Patient presents with  . Coronary Artery Disease  . Chest Pain  . Hypertension  . Hyperlipidemia    Follow up visit - seen for Dr. Excell Seltzer    History of Present Illness: Sara Trujillo is a 55 y.o. female who presents today for a follow up visit. Seen for Dr. Excell Seltzer.   She has history of tobacco abuse, CAD with inferior STEMI, HTN, hyperlipidemia, GERD, anxiety/depression and bipolar disorder.   She was admitted 3/19-3/23 with an inferior STEMI treated with DES x 2 to distal and prox RCA.She was noted to have obstructive disease of the mCx and planned to have staged PCI. This was attempted 01/06/15 but there was difficulty crossing the lesion and resulted in a dissection and attempt at re-crossing into the true lumen was unsuccessful. Of note, she also has moderate non-obstructive disease of LAD.A 2D Echo 01/06/15: EF 50-55%, probable mild HK of inferior myocardium, grade 1 DD, mild MR. She was discharged with standard MI therapy and did well until 3/25, when she had severe emotional upset related to her dog's illness (the dog passed away).In that setting, she developed severe c/p and presented to [facility] where ECG was non-acute but troponin was elevated. Her Troponin with her MI was 59, down to 9 at discharge. It was 3.25 on 01/10/15 and falling to 2 by discharge the next day.She was then seen if f/u 01/17/15 by Ronie Spies and was doing OK. Lots of stress at home- "my husband is dying of cancer".  She again presented to the ED 01/25/15 with chest discomfort "my chest just doesn't feel right". Her Troponin was negative then and she was re assured.   She was last seen back in May of 2016 by Corine Shelter, PA and has not been seen back since.   Comes back today. Here alone. Her husband passed  from colon cancer back in November. She has returned but notes that it pretty "much sucks" right now. No chest pain. Breathing is ok. She went back to smoking when she was told her husband only had a month left to live. She understands the importance of stopping. BP typically runs high. Has lost weight. She is ok with how she is doing overall.  She is not fasting today and has not had recent labs.   Past Medical History  Diagnosis Date  . CAD (coronary artery disease)     a. 12/2014 Inferior Inf STEMI/PCI: LM nl, LAD 60p, D1 50-60p, LCX nondom 19m, RI 80,m RCA dom, 100p (DES to prox and distal RCA), EF 60%;  b 12/2014 Staged attempted PCI of AV groove LCX with resultant dissection->aborted;  c. 12/2014 Echo: EF 50-55%, mod LVH, mild inf HK, Gr 1 DD, mild MR.  . Tobacco abuse   . Hypertension   . Hyperlipidemia   . GERD (gastroesophageal reflux disease)   . Daily headache   . Anxiety   . Depression   . Bipolar disorder [facility])     Past Surgical History  Procedure Laterality Date  . Left heart catheterization with coronary angiogram N/A 01/04/2015    Procedure: LEFT HEART CATHETERIZATION WITH CORONARY ANGIOGRAM;  Surgeon: Runell Gess, MD; LAD 60%, D1 60%, CFX 90%, RI 80%, RCA 100%>>0% w/  2.25 x 12 mm Xience DES, EF 60%    .  Percutaneous coronary stent intervention (pci-s)  01/04/2015    Procedure: PERCUTANEOUS CORONARY STENT INTERVENTION (PCI-S);  Surgeon: Runell Gess, MD;  2.25 x 12 mm Xience DES to RCA    . Percutaneous coronary stent intervention (pci-s) N/A 01/06/2015    Procedure: PERCUTANEOUS CORONARY STENT INTERVENTION (PCI-S);  Surgeon: Runell Gess, MD; RCA OK, CFX unsuccessfult PCI 2nd distal dissection   . Laparoscopic cholecystectomy  1990's     Medications: Current Outpatient Prescriptions  Medication Sig Dispense Refill  . aspirin 81 MG tablet Take 1 tablet (81 mg total) by mouth daily.    Marland Kitchen atorvastatin (LIPITOR) 80 MG tablet Take 1 tablet (80 mg total) by mouth  daily. 90 tablet 3  . LORazepam (ATIVAN) 1 MG tablet Take 1 mg by mouth every 8 (eight) hours.    . Melatonin 10 MG CAPS Take 10 mg by mouth at bedtime.    . metoprolol tartrate (LOPRESSOR) 25 MG tablet Take 0.5 tablets (12.5 mg total) by mouth 2 (two) times daily. 90 tablet 3  . nitroGLYCERIN (NITROSTAT) 0.4 MG SL tablet Place 1 tablet (0.4 mg total) under the tongue every 5 (five) minutes as needed for chest pain. 25 tablet 3  . ticagrelor (BRILINTA) 90 MG TABS tablet Take 1 tablet (90 mg total) by mouth 2 (two) times daily. 180 tablet 3  . lisinopril (PRINIVIL,ZESTRIL) 20 MG tablet Take 1 tablet (20 mg total) by mouth daily. 30 tablet 11   No current facility-administered medications for this visit.    Allergies: Allergies  Allergen Reactions  . Penicillins   . Sulfa Antibiotics     Parents both allergic and was told not to take sulfa drugs    Social History: The patient  reports that she quit smoking about 10 months ago. Her smoking use included Cigarettes. She has a 21.75 pack-year smoking history. She has never used smokeless tobacco. She reports that she drinks alcohol. She reports that she does not use illicit drugs.   Family History: The patient's family history includes Alzheimer's disease in her mother; Arrhythmia in her mother; COPD in her mother; Cancer - Colon in her father; Hyperlipidemia in her sister.   Review of Systems: Please see the history of present illness.   Otherwise, the review of systems is positive for none.   All other systems are reviewed and negative.   Physical Exam: VS:  BP 150/90 mmHg  Pulse 58  Ht  (1.702 m)  Wt 157 lb 12.8 oz (71.578 kg)  BMI 24.71 kg/m2 .  BMI Body mass index is 24.71 kg/(m^2).  Wt Readings from Last 3 Encounters:  11/19/15 157 lb 12.8 oz (71.578 kg)  02/25/15 169 lb 12.8 oz (77.021 kg)  01/25/15 163 lb (73.936 kg)   BP is 160/100 by me.   General: Pleasant. Affect is a little flat but she is in no acute distress.  Weight is down.  HEENT: Normal. Neck: Supple, no JVD, carotid bruits, or masses noted.  Cardiac: Regular rate and rhythm. No murmurs, rubs, or gallops. No edema.  Respiratory:  Lungs are clear to auscultation bilaterally with normal work of breathing.  GI: Soft and nontender.  MS: No deformity or atrophy. Gait and ROM intact. Skin: Warm and dry. Color is normal.  Neuro:  Strength and sensation are intact and no gross focal deficits noted.  Psych: Alert, appropriate and with normal affect.   LABORATORY DATA:  EKG:  EKG is not ordered today.   Lab Results  Component Value  Date   WBC 9.7 01/25/2015   HGB 13.6 01/25/2015   HCT 41.1 01/25/2015   PLT 352 01/25/2015   GLUCOSE 99 01/25/2015   CHOL 236* 01/04/2015   TRIG 154* 01/04/2015   HDL 35* 01/04/2015   LDLCALC 170* 01/04/2015   ALT 16 01/17/2015   AST 14 01/17/2015   NA 140 01/25/2015   K 3.9 01/25/2015   CL 107 01/25/2015   CREATININE 0.69 01/25/2015   BUN 12 01/25/2015   CO2 21 01/25/2015   INR 1.04 01/05/2015   HGBA1C 5.6 01/05/2015    BNP (last 3 results) No results for input(s): BNP in the last 8760 hours.  ProBNP (last 3 results) No results for input(s): PROBNP in the last 8760 hours.   Other Studies Reviewed Today:  Echo Study Conclusions from 12/2104  - Left ventricle: Wall thickness was increased in a pattern of moderate LVH. Systolic function was normal. The estimated ejection fraction was in the range of 50% to 55%. Probable mild hypokinesis of the inferior myocardium. Doppler parameters are consistent with abnormal left ventricular relaxation (grade 1 diastolic dysfunction). - Mitral valve: There was mild regurgitation.  Assessment/Plan: 1. CAD with prior inferior STEMI treated with DES x 2 to distal and prox RCA back in March of 2016.She was noted to have obstructive disease of the mCx and planned to have staged PCI. This was attempted 01/06/15 but there was difficulty crossing the  lesion and resulted in a dissection and attempt at re-crossing into the true lumen was unsuccessful. She has moderate non-obstructive disease of LAD also. She is managed medically. Currently with no symptoms but needs to work on CV risk factor modification.   2. Significant situational stress - she is grieving.   3. HTN - BP elevated - increasing Lisinopril to 20 mg a day  4. HLD - on statin and needs labs - will do on return OV with me  5. Tobacco abuse - counseled about stopping but she is not ready.   6. Prior carotid bruit - needs doppler study - will discuss on return.   Current medicines are reviewed with the patient today.  The patient does not have concerns regarding medicines other than what has been noted above.  The following changes have been made:  See above.  Labs/ tests ordered today include:   No orders of the defined types were placed in this encounter.     Disposition:   FU with me in 6 weeks with fasting labs.   Patient is agreeable to this plan and will call if any problems develop in the interim.   Signed: Rosalio Macadamia, RN, ANP-C 11/19/2015 1:54 PM  [facility] [address] [place], [place]  [address] Phone: [phone] Fax: [phone]

## 2015-12-23 ENCOUNTER — Encounter: Payer: Self-pay | Admitting: *Deleted

## 2015-12-29 ENCOUNTER — Encounter: Payer: Self-pay | Admitting: Nurse Practitioner

## 2015-12-29 ENCOUNTER — Ambulatory Visit (INDEPENDENT_AMBULATORY_CARE_PROVIDER_SITE_OTHER): Payer: Managed Care, Other (non HMO) | Admitting: Nurse Practitioner

## 2015-12-29 VITALS — BP 134/100 | HR 96 | Ht 67.0 in | Wt 156.2 lb

## 2015-12-29 DIAGNOSIS — I2119 ST elevation (STEMI) myocardial infarction involving other coronary artery of inferior wall: Secondary | ICD-10-CM

## 2015-12-29 DIAGNOSIS — I2510 Atherosclerotic heart disease of native coronary artery without angina pectoris: Secondary | ICD-10-CM | POA: Diagnosis not present

## 2015-12-29 DIAGNOSIS — Z720 Tobacco use: Secondary | ICD-10-CM | POA: Diagnosis not present

## 2015-12-29 DIAGNOSIS — E785 Hyperlipidemia, unspecified: Secondary | ICD-10-CM

## 2015-12-29 DIAGNOSIS — I10 Essential (primary) hypertension: Secondary | ICD-10-CM

## 2015-12-29 DIAGNOSIS — Z9861 Coronary angioplasty status: Secondary | ICD-10-CM

## 2015-12-29 LAB — HEPATIC FUNCTION PANEL
ALT: 13 U/L (ref 6–29)
AST: 13 U/L (ref 10–35)
Albumin: 3.9 g/dL (ref 3.6–5.1)
Alkaline Phosphatase: 103 U/L (ref 33–130)
Bilirubin, Direct: 0.1 mg/dL (ref <=0.2)
Indirect Bilirubin: 0.2 mg/dL (ref 0.2–1.2)
Total Bilirubin: 0.3 mg/dL (ref 0.2–1.2)
Total Protein: 7.1 g/dL (ref 6.1–8.1)

## 2015-12-29 LAB — BASIC METABOLIC PANEL
BUN: 30 mg/dL — ABNORMAL HIGH (ref 7–25)
CO2: 23 mmol/L (ref 20–31)
Calcium: 9.3 mg/dL (ref 8.6–10.4)
Chloride: 107 mmol/L (ref 98–110)
Creat: 0.6 mg/dL (ref 0.50–1.05)
Glucose, Bld: 100 mg/dL — ABNORMAL HIGH (ref 65–99)
Potassium: 4.5 mmol/L (ref 3.5–5.3)
Sodium: 138 mmol/L (ref 135–146)

## 2015-12-29 LAB — LIPID PANEL
Cholesterol: 135 mg/dL (ref 125–200)
HDL: 34 mg/dL — ABNORMAL LOW (ref >=46)
LDL Cholesterol: 83 mg/dL (ref <130)
Total CHOL/HDL Ratio: 4 Ratio (ref <=5.0)
Triglycerides: 92 mg/dL (ref <150)
VLDL: 18 mg/dL (ref <30)

## 2015-12-29 MED ORDER — NITROGLYCERIN 0.4 MG SL SUBL: 0.4000 mg | SUBLINGUAL_TABLET | SUBLINGUAL | Status: DC | PRN

## 2015-12-29 NOTE — Progress Notes (Signed)
CARDIOLOGY OFFICE NOTE  Date:  12/29/2015    Britta MccreedyMelinda Rickels Date of Birth: 1960-12-06 Medical Record #[id number]  PCP:  No PCP Per Patient  Cardiologist:  Excell Seltzerooper    Chief Complaint  Patient presents with  . Coronary Artery Disease    1 month check - seen for Dr. Excell Seltzerooper    History of Present Illness: Britta MccreedyMelinda Curfman is a 55 y.o. female who presents today for a one month check. Seen for Dr. Excell Seltzerooper.   She has history of tobacco abuse, CAD with inferior STEMI, HTN, hyperlipidemia, GERD, anxiety/depression and bipolar disorder.   She was admitted 3/19-3/23 of 2016 with an inferior STEMI treated with DES x 2 to distal and prox RCA.She was noted to have obstructive disease of the mCx and planned to have staged PCI. This was attempted 01/06/15 but there was difficulty crossing the lesion and resulted in a dissection and attempt at re-crossing into the true lumen was unsuccessful. Of note, she also has moderate non-obstructive disease of LAD.A 2D Echo 01/06/15: EF 50-55%, probable mild HK of inferior myocardium, grade 1 DD, mild MR. She was discharged with standard MI therapy and did well until 3/25, when she had severe emotional upset related to her dog's illness (the dog passed away).In that setting, she developed severe c/p and presented to [facility] where ECG was non-acute but troponin was elevated. Her Troponin with her MI was 59, down to 9 at discharge. It was 3.25 on 01/10/15 and falling to 2 by discharge the next day.She was then seen if f/u 01/17/15 by Ronie Spiesayna Dunn and was doing OK. Lots of stress at home- "my husband is dying of cancer". She again presented to the ED 01/25/15 with chest discomfort "my chest just doesn't feel right". Her Troponin was negative then and she was re assured.   She had not been seen back until May of 2016 by Corine ShelterLuke Kilroy, PA. I saw her last month - her husband had passed. She was smoking. No chest pain.  She had lost weight. BP high and I increased  her ACE.   Comes back today. Here alone. Seems depressed. No chest pain. Still smoking. She knows she needs to stop. Trying to get more activity. Not short of breath. No palpitations. Has not had her medicines today yet.    Past Medical History  Diagnosis Date  . CAD (coronary artery disease)     a. 12/2014 Inferior Inf STEMI/PCI: LM nl, LAD 60p, D1 50-60p, LCX nondom 2245m, RI 80,m RCA dom, 100p (DES to prox and distal RCA), EF 60%;  b 12/2014 Staged attempted PCI of AV groove LCX with resultant dissection->aborted;  c. 12/2014 Echo: EF 50-55%, mod LVH, mild inf HK, Gr 1 DD, mild MR.  . Tobacco abuse   . Hypertension   . Hyperlipidemia   . GERD (gastroesophageal reflux disease)   . Daily headache   . Anxiety   . Depression   . Bipolar disorder [facility](HCC)     Past Surgical History  Procedure Laterality Date  . Left heart catheterization with coronary angiogram N/A 01/04/2015    Procedure: LEFT HEART CATHETERIZATION WITH CORONARY ANGIOGRAM;  Surgeon: Runell GessJonathan J Berry, MD; LAD 60%, D1 60%, CFX 90%, RI 80%, RCA 100%>>0% w/  2.25 x 12 mm Xience DES, EF 60%    . Percutaneous coronary stent intervention (pci-s)  01/04/2015    Procedure: PERCUTANEOUS CORONARY STENT INTERVENTION (PCI-S);  Surgeon: Runell GessJonathan J Berry, MD;  2.25 x 12 mm Xience DES to  RCA    . Percutaneous coronary stent intervention (pci-s) N/A 01/06/2015    Procedure: PERCUTANEOUS CORONARY STENT INTERVENTION (PCI-S);  Surgeon: Runell Gess, MD; RCA OK, CFX unsuccessfult PCI 2nd distal dissection   . Laparoscopic cholecystectomy  1990's     Medications: Current Outpatient Prescriptions  Medication Sig Dispense Refill  . aspirin 81 MG tablet Take 1 tablet (81 mg total) by mouth daily.    Marland Kitchen atorvastatin (LIPITOR) 80 MG tablet Take 1 tablet (80 mg total) by mouth daily. 90 tablet 3  . lisinopril (PRINIVIL,ZESTRIL) 20 MG tablet Take 1 tablet (20 mg total) by mouth daily. 30 tablet 11  . LORazepam (ATIVAN) 1 MG tablet Take 1 mg by mouth  every 8 (eight) hours.    . Melatonin 10 MG CAPS Take 10 mg by mouth at bedtime.    . metoprolol tartrate (LOPRESSOR) 25 MG tablet Take 0.5 tablets (12.5 mg total) by mouth 2 (two) times daily. 90 tablet 3  . ticagrelor (BRILINTA) 90 MG TABS tablet Take 1 tablet (90 mg total) by mouth 2 (two) times daily. 180 tablet 3  . nitroGLYCERIN (NITROSTAT) 0.4 MG SL tablet Place 1 tablet (0.4 mg total) under the tongue every 5 (five) minutes as needed for chest pain. 25 tablet 3   No current facility-administered medications for this visit.    Allergies: Allergies  Allergen Reactions  . Penicillins   . Sulfa Antibiotics     Parents both allergic and was told not to take sulfa drugs    Social History: The patient  reports that she has been smoking Cigarettes.  She has a 21.75 pack-year smoking history. She has never used smokeless tobacco. She reports that she drinks alcohol. She reports that she does not use illicit drugs.   Family History: The patient's family history includes Alzheimer's disease in her mother; Arrhythmia in her mother; COPD in her mother; Cancer - Colon in her father; Hyperlipidemia in her sister.   Review of Systems: Please see the history of present illness.   Otherwise, the review of systems is positive for none.   All other systems are reviewed and negative.   Physical Exam: VS:  BP 134/100 mmHg  Pulse 96  Ht  (1.702 m)  Wt 156 lb 3.2 oz (70.852 kg)  BMI 24.46 kg/m2 .  BMI Body mass index is 24.46 kg/(m^2).  Wt Readings from Last 3 Encounters:  12/29/15 156 lb 3.2 oz (70.852 kg)  11/19/15 157 lb 12.8 oz (71.578 kg)  02/25/15 169 lb 12.8 oz (77.021 kg)   BP is 118/85 by me in both arms.   General: Pleasant. Well developed, well nourished and in no acute distress.  HEENT: Normal. Neck: Supple, no JVD, + bilateral carotid bruits, no masses noted.  Cardiac: Regular rate and rhythm. Rate is a little fast. No murmurs, rubs, or gallops. No edema.  Respiratory:   Lungs are clear to auscultation bilaterally with normal work of breathing.  GI: Soft and nontender.  MS: No deformity or atrophy. Gait and ROM intact. Skin: Warm and dry. Color is normal.  Neuro:  Strength and sensation are intact and no gross focal deficits noted.  Psych: Alert, appropriate and with normal affect.   LABORATORY DATA:  EKG:  EKG is not ordered today.  Lab Results  Component Value Date   WBC 9.7 01/25/2015   HGB 13.6 01/25/2015   HCT 41.1 01/25/2015   PLT 352 01/25/2015   GLUCOSE 99 01/25/2015   CHOL 236* 01/04/2015  TRIG 154* 01/04/2015   HDL 35* 01/04/2015   LDLCALC 170* 01/04/2015   ALT 16 01/17/2015   AST 14 01/17/2015   NA 140 01/25/2015   K 3.9 01/25/2015   CL 107 01/25/2015   CREATININE 0.69 01/25/2015   BUN 12 01/25/2015   CO2 21 01/25/2015   INR 1.04 01/05/2015   HGBA1C 5.6 01/05/2015    BNP (last 3 results) No results for input(s): BNP in the last 8760 hours.  ProBNP (last 3 results) No results for input(s): PROBNP in the last 8760 hours.   Other Studies Reviewed Today:  Echo Study Conclusions from 12/2104  - Left ventricle: Wall thickness was increased in a pattern of moderate LVH. Systolic function was normal. The estimated ejection fraction was in the range of 50% to 55%. Probable mild hypokinesis of the inferior myocardium. Doppler parameters are consistent with abnormal left ventricular relaxation (grade 1 diastolic dysfunction). - Mitral valve: There was mild regurgitation.  Assessment/Plan: 1. CAD with prior inferior STEMI treated with DES x 2 to distal and prox RCA back in March of 2016.She was noted to have obstructive disease of the mCx and planned to have staged PCI. This was attempted 01/06/15 but there was difficulty crossing the lesion and resulted in a dissection and attempt at re-crossing into the true lumen was unsuccessful. She has moderate non-obstructive disease of LAD also. She is managed medically.  Currently with no symptoms but needs to work on CV risk factor modification.   2. Significant situational stress - she is grieving. Depressed.   3. HTN - improved by my check. I have left her on her current regimen for now.   4. HLD - on statin and needs labs - checking today.   5. Tobacco abuse - counseled about stopping but she is not ready.   6. Prior carotid bruit - needs doppler study - will arrange.   Current medicines are reviewed with the patient today.  The patient does not have concerns regarding medicines other than what has been noted above.  The following changes have been made:  See above.  Labs/ tests ordered today include:    Orders Placed This Encounter  Procedures  . Basic metabolic panel  . Hepatic function panel  . Lipid panel     Disposition:   FU with me in 4  months.   Patient is agreeable to this plan and will call if any problems develop in the interim.   Signed: Rosalio Macadamia, RN, ANP-C 12/29/2015 8:19 AM  [facility] [address] [place], [place]  [address] Phone: [phone] Fax: [phone]

## 2015-12-29 NOTE — Patient Instructions (Addendum)
We will be checking the following labs today - BMET, HPF, Lipids   Medication Instructions:    Continue with your current medicines.   I refilled the NTG today.    Testing/Procedures To Be Arranged:  Carotid doppler  Follow-Up:   See me in about 4 months    Other Special Instructions:   Think about what we talked about today.     If you need a refill on your cardiac medications before your next appointment, please call your pharmacy.   Call the [facility] office at [phone] if you have any questions, problems or concerns.

## 2016-01-08 ENCOUNTER — Telehealth: Payer: Self-pay | Admitting: Cardiovascular Disease

## 2016-01-08 NOTE — Telephone Encounter (Signed)
Pt is aware of lab results and MD's recommendations.  

## 2016-01-08 NOTE — Telephone Encounter (Signed)
NEW MESSAGE   PT IS RETURNING CALL FOR LABS

## 2016-01-12 ENCOUNTER — Ambulatory Visit (HOSPITAL_COMMUNITY)
Admission: RE | Admit: 2016-01-12 | Discharge: 2016-01-12 | Disposition: A | Discharge status: Home / Self Care | Payer: Managed Care, Other (non HMO) | Source: Ambulatory Visit | Attending: Cardiovascular Disease | Admitting: Cardiovascular Disease

## 2016-01-12 DIAGNOSIS — I2510 Atherosclerotic heart disease of native coronary artery without angina pectoris: Secondary | ICD-10-CM | POA: Insufficient documentation

## 2016-01-12 DIAGNOSIS — I6523 Occlusion and stenosis of bilateral carotid arteries: Secondary | ICD-10-CM | POA: Diagnosis not present

## 2016-01-12 DIAGNOSIS — E785 Hyperlipidemia, unspecified: Secondary | ICD-10-CM | POA: Insufficient documentation

## 2016-01-12 DIAGNOSIS — I10 Essential (primary) hypertension: Secondary | ICD-10-CM | POA: Insufficient documentation

## 2016-01-12 DIAGNOSIS — I252 Old myocardial infarction: Secondary | ICD-10-CM | POA: Insufficient documentation

## 2016-01-12 DIAGNOSIS — Z9861 Coronary angioplasty status: Secondary | ICD-10-CM | POA: Insufficient documentation

## 2016-01-12 DIAGNOSIS — Z720 Tobacco use: Secondary | ICD-10-CM | POA: Diagnosis not present

## 2016-01-12 DIAGNOSIS — I2119 ST elevation (STEMI) myocardial infarction involving other coronary artery of inferior wall: Secondary | ICD-10-CM

## 2016-01-12 DIAGNOSIS — R0989 Other specified symptoms and signs involving the circulatory and respiratory systems: Secondary | ICD-10-CM | POA: Diagnosis not present

## 2016-03-10 ENCOUNTER — Encounter: Payer: Self-pay | Admitting: Nurse Practitioner

## 2016-05-05 ENCOUNTER — Ambulatory Visit: Payer: Managed Care, Other (non HMO) | Admitting: Nurse Practitioner

## 2016-05-11 ENCOUNTER — Ambulatory Visit: Payer: Managed Care, Other (non HMO) | Admitting: Nurse Practitioner

## 2016-06-14 ENCOUNTER — Encounter: Payer: Self-pay | Admitting: Nurse Practitioner

## 2016-06-14 ENCOUNTER — Ambulatory Visit (INDEPENDENT_AMBULATORY_CARE_PROVIDER_SITE_OTHER): Payer: Managed Care, Other (non HMO) | Admitting: Nurse Practitioner

## 2016-06-14 ENCOUNTER — Encounter (INDEPENDENT_AMBULATORY_CARE_PROVIDER_SITE_OTHER): Payer: Self-pay

## 2016-06-14 VITALS — BP 128/90 | HR 74 | Ht 67.5 in | Wt 158.4 lb

## 2016-06-14 DIAGNOSIS — I10 Essential (primary) hypertension: Secondary | ICD-10-CM

## 2016-06-14 DIAGNOSIS — I2510 Atherosclerotic heart disease of native coronary artery without angina pectoris: Secondary | ICD-10-CM | POA: Diagnosis not present

## 2016-06-14 DIAGNOSIS — M25531 Pain in right wrist: Secondary | ICD-10-CM | POA: Diagnosis not present

## 2016-06-14 DIAGNOSIS — E785 Hyperlipidemia, unspecified: Secondary | ICD-10-CM

## 2016-06-14 DIAGNOSIS — M25532 Pain in left wrist: Secondary | ICD-10-CM

## 2016-06-14 LAB — HEPATIC FUNCTION PANEL
ALT: 10 U/L (ref 6–29)
AST: 10 U/L (ref 10–35)
Albumin: 3.7 g/dL (ref 3.6–5.1)
Alkaline Phosphatase: 101 U/L (ref 33–130)
Bilirubin, Direct: 0.1 mg/dL (ref <=0.2)
Indirect Bilirubin: 0.2 mg/dL (ref 0.2–1.2)
Total Bilirubin: 0.3 mg/dL (ref 0.2–1.2)
Total Protein: 7.5 g/dL (ref 6.1–8.1)

## 2016-06-14 LAB — LIPID PANEL
Cholesterol: 137 mg/dL (ref 125–200)
HDL: 32 mg/dL — ABNORMAL LOW (ref >=46)
LDL Cholesterol: 71 mg/dL (ref <130)
Total CHOL/HDL Ratio: 4.3 Ratio (ref <=5.0)
Triglycerides: 169 mg/dL — ABNORMAL HIGH (ref <150)
VLDL: 34 mg/dL — ABNORMAL HIGH (ref <30)

## 2016-06-14 LAB — BASIC METABOLIC PANEL
BUN: 19 mg/dL (ref 7–25)
CO2: 23 mmol/L (ref 20–31)
Calcium: 9.2 mg/dL (ref 8.6–10.4)
Chloride: 110 mmol/L (ref 98–110)
Creat: 0.71 mg/dL (ref 0.50–1.05)
Glucose, Bld: 93 mg/dL (ref 65–99)
Potassium: 4.4 mmol/L (ref 3.5–5.3)
Sodium: 140 mmol/L (ref 135–146)

## 2016-06-14 NOTE — Progress Notes (Signed)
CARDIOLOGY OFFICE NOTE  Date:  06/14/2016    [name] Date of Birth: [birth year] Medical Record #[id number]  PCP:  No PCP Per Patient  Cardiologist:  Servando Snare & Burt Knack    Chief Complaint  Patient presents with  . Coronary Artery Disease  . Hypertension  . Hyperlipidemia    5 month check - seen for Dr. Burt Knack    History of Present Illness: Sara Trujillo is a 55 y.o. female who presents today for a 5 month check.  Seen for Dr. Burt Knack.   She has history of tobacco abuse, CAD with inferior STEMI, HTN, hyperlipidemia, GERD, anxiety/depression and bipolar disorder.   She was admitted 3/19-3/23 of 2016 with an inferior STEMI treated with DES x 2 to distal and prox RCA.She was noted to have obstructive disease of the mCx and planned to have staged PCI. This was attempted 01/06/15 but there was difficulty crossing the lesion and resulted in a dissection and attempt at re-crossing into the true lumen was unsuccessful. Of note, she also has moderate non-obstructive disease of LAD.A 2D Echo 01/06/15: EF 50-55%, probable mild HK of inferior myocardium, grade 1 DD, mild MR. She was discharged with standard MI therapy and did well until 3/25, when she had severe emotional upset related to her dog's illness (the dog passed away).In that setting, she developed severe c/p and presented to [facility] where ECG was non-acute but troponin was elevated. Her Troponin with her MI was 59, down to 9 at discharge. It was 3.25 on 01/10/15 and falling to 2 by discharge the next day.She was then seen if f/u 01/17/15 by Melina Copa and was doing OK. Lots of stress at home- "my husband is dying of cancer". She again presented to the ED 01/25/15 with chest discomfort "my chest just doesn't feel right". Her Troponin was negative then and she was re assured.   She had not been seen back since May of 2016 by Kerin Ransom, [place]. I saw her back in 2023-01-10 - her husband had passed. She was smoking. No  chest pain.  She had lost weight. BP high and I increased her ACE. Last seen back in March - she was depressed - cardiac status seemed to be ok.   Comes back today. Here alone. Seems to be doing ok. No chest pain. Breathing is good. Biggest complaint is bilateral wrist pain - some in the fingers - probably taking way too much NSAID. BP fair. No NTG use. Has a new puppy - this seems to make her happy. She is ok with how she is doing at this time. She stopped smoking for a few months but has restarted about a month ago.   Past Medical History:  Diagnosis Date  . Anxiety   . Bipolar disorder ([place])   . CAD (coronary artery disease)    a. 12/2014 Inferior Inf STEMI/PCI: LM nl, LAD 60p, D1 50-60p, LCX nondom 4m RI 80,m RCA dom, 100p (DES to prox and distal RCA), EF 60%;  b 12/2014 Staged attempted PCI of AV groove LCX with resultant dissection->aborted;  c. 12/2014 Echo: EF 50-55%, mod LVH, mild inf HK, Gr 1 DD, mild MR.  . Daily headache   . Depression   . GERD (gastroesophageal reflux disease)   . Hyperlipidemia   . Hypertension   . Tobacco abuse     Past Surgical History:  Procedure Laterality Date  . LAPAROSCOPIC CHOLECYSTECTOMY  1990's  . LEFT HEART CATHETERIZATION WITH CORONARY ANGIOGRAM N/A 01/04/2015  Procedure: LEFT HEART CATHETERIZATION WITH CORONARY ANGIOGRAM;  Surgeon: Lorretta Harp, MD; LAD 60%, D1 60%, CFX 90%, RI 80%, RCA 100%>>0% w/  2.25 x 12 mm Xience DES, EF 60%    . PERCUTANEOUS CORONARY STENT INTERVENTION (PCI-S)  01/04/2015   Procedure: PERCUTANEOUS CORONARY STENT INTERVENTION (PCI-S);  Surgeon: Lorretta Harp, MD;  2.25 x 12 mm Xience DES to RCA    . PERCUTANEOUS CORONARY STENT INTERVENTION (PCI-S) N/A 01/06/2015   Procedure: PERCUTANEOUS CORONARY STENT INTERVENTION (PCI-S);  Surgeon: Lorretta Harp, MD; RCA OK, CFX unsuccessfult PCI 2nd distal dissection      Medications: Current Outpatient Prescriptions  Medication Sig Dispense Refill  . aspirin 81 MG tablet  Take 1 tablet (81 mg total) by mouth daily.    Marland Kitchen atorvastatin (LIPITOR) 80 MG tablet Take 1 tablet (80 mg total) by mouth daily. 90 tablet 3  . lisinopril (PRINIVIL,ZESTRIL) 20 MG tablet Take 1 tablet (20 mg total) by mouth daily. 30 tablet 11  . Melatonin 10 MG CAPS Take 10 mg by mouth at bedtime.    . metoprolol tartrate (LOPRESSOR) 25 MG tablet Take 0.5 tablets (12.5 mg total) by mouth 2 (two) times daily. 90 tablet 3  . nitroGLYCERIN (NITROSTAT) 0.4 MG SL tablet Place 1 tablet (0.4 mg total) under the tongue every 5 (five) minutes as needed for chest pain. 25 tablet 3  . ticagrelor (BRILINTA) 90 MG TABS tablet Take 1 tablet (90 mg total) by mouth 2 (two) times daily. 180 tablet 3   No current facility-administered medications for this visit.     Allergies: Allergies  Allergen Reactions  . Penicillins   . Sulfa Antibiotics     Parents both allergic and was told not to take sulfa drugs    Social History: The patient  reports that she has been smoking Cigarettes.  She has a 21.75 pack-year smoking history. She has never used smokeless tobacco. She reports that she drinks alcohol. She reports that she does not use drugs.   Family History: The patient's family history includes Alzheimer's disease in her mother; Arrhythmia in her mother; COPD in her mother; Cancer - Colon in her father; Hyperlipidemia in her sister.   Review of Systems: Please see the history of present illness.   Otherwise, the review of systems is positive for none.   All other systems are reviewed and negative.   Physical Exam: VS:  BP 128/90   Pulse 74   Ht 5' 7.5" (1.715 m)   Wt 158 lb 6.4 oz (71.8 kg)   BMI 24.44 kg/m  .  BMI Body mass index is 24.44 kg/m.  Wt Readings from Last 3 Encounters:  06/14/16 158 lb 6.4 oz (71.8 kg)  12/29/15 156 lb 3.2 oz (70.9 kg)  11/19/15 157 lb 12.8 oz (71.6 kg)    General: Pleasant. Affect a little flat. Well developed, well nourished and in no acute distress.   HEENT:  Normal.  Neck: Supple, no JVD, carotid bruits, or masses noted.  Cardiac: Regular rate and rhythm. No murmurs, rubs, or gallops. No edema.  Respiratory:  Lungs are clear to auscultation bilaterally with normal work of breathing.  GI: Soft and nontender.  MS: No deformity or atrophy. Gait and ROM intact.  Skin: Warm and dry. Color is normal.  Neuro:  Strength and sensation are intact and no gross focal deficits noted.  Psych: Alert, appropriate and with normal affect.   LABORATORY DATA:  EKG:  EKG is ordered today. This shows  NSR with some T wave flattening.   Lab Results  Component Value Date   WBC 9.7 01/25/2015   HGB 13.6 01/25/2015   HCT 41.1 01/25/2015   PLT 352 01/25/2015   GLUCOSE 100 (H) 12/29/2015   CHOL 135 12/29/2015   TRIG 92 12/29/2015   HDL 34 (L) 12/29/2015   LDLCALC 83 12/29/2015   ALT 13 12/29/2015   AST 13 12/29/2015   NA 138 12/29/2015   K 4.5 12/29/2015   CL 107 12/29/2015   CREATININE 0.60 12/29/2015   BUN 30 (H) 12/29/2015   CO2 23 12/29/2015   INR 1.04 01/05/2015   HGBA1C 5.6 01/05/2015    BNP (last 3 results) No results for input(s): BNP in the last 8760 hours.  ProBNP (last 3 results) No results for input(s): PROBNP in the last 8760 hours.   Other Studies Reviewed Today:  Echo Study Conclusions from 12/2104  - Left ventricle: Wall thickness was increased in a pattern of moderate LVH. Systolic function was normal. The estimated ejection fraction was in the range of 50% to 55%. Probable mild hypokinesis of the inferior myocardium. Doppler parameters are consistent with abnormal left ventricular relaxation (grade 1 diastolic dysfunction). - Mitral valve: There was mild regurgitation.  Assessment/Plan: 1. CAD with prior inferior STEMI treated with DES x 2 to distal and prox RCA back in March of 2016.She was noted to have obstructive disease of the mCx and planned to have staged PCI. This was attempted 01/06/15 but there was  difficulty crossing the lesion and resulted in a dissection and attempt at re-crossing into the true lumen was unsuccessful. She has moderate non-obstructive disease of LAD also. She is managed medically. Currently with no symptoms but needs to work on CV risk factor modification.   2. Significant situational stress - she seems a little better today.   3. HTN - stable. I have left her on her current regimen for now.   4. HLD - on statin and needs labs - checking today.   5. Tobacco abuse - counseled about stopping but she is not ready.   6. Prior carotid bruit - needs doppler study repeated in March of 2019.   7. Bilateral wrist pain - will refer to Dr. Amedeo Plenty for evaluation.   Current medicines are reviewed with the patient today.  The patient does not have concerns regarding medicines other than what has been noted above.  The following changes have been made:  See above.  Labs/ tests ordered today include:    Orders Placed This Encounter  Procedures  . Basic metabolic panel  . Hepatic function panel  . Lipid panel  . Sed Rate (ESR)  . Ambulatory referral to Orthopedic Surgery  . EKG 12-Lead     Disposition:   FU with me in 6 months.   Patient is agreeable to this plan and will call if any problems develop in the interim.   Signed: Burtis Junes, RN, ANP-C 06/14/2016 9:58 AM  [facility] [address] [place], [place]  [address] Phone: [phone] Fax: [phone]

## 2016-06-14 NOTE — Patient Instructions (Addendum)
We will be checking the following labs today - BMET, Lipids, HPF and sed rate   Medication Instructions:    Continue with your current medicines.     Testing/Procedures To Be Arranged:  N/A  Follow-Up:   See me in 6 months with fasting labs  Referral to Dr. Amanda PeaGramig - bilateral wrist pain    Other Special Instructions:   Try to work on your smoking.     If you need a refill on your cardiac medications before your next appointment, please call your pharmacy.   Call the [facility] office at [phone] if you have any questions, problems or concerns.

## 2016-06-15 LAB — SEDIMENTATION RATE: Sed Rate: 69 mm/hr — ABNORMAL HIGH (ref 0–30)

## 2016-10-26 ENCOUNTER — Other Ambulatory Visit: Payer: Self-pay | Admitting: Nurse Practitioner

## 2016-11-02 IMAGING — CR DG CHEST 2V
2 series · 2 of 2 positions shown · non-contrast
Comparison: None.

CLINICAL DATA: Chest pain 15 minutes prior to admission. Former
smoker.

EXAM:
CHEST  2 VIEW

[w chest pa]
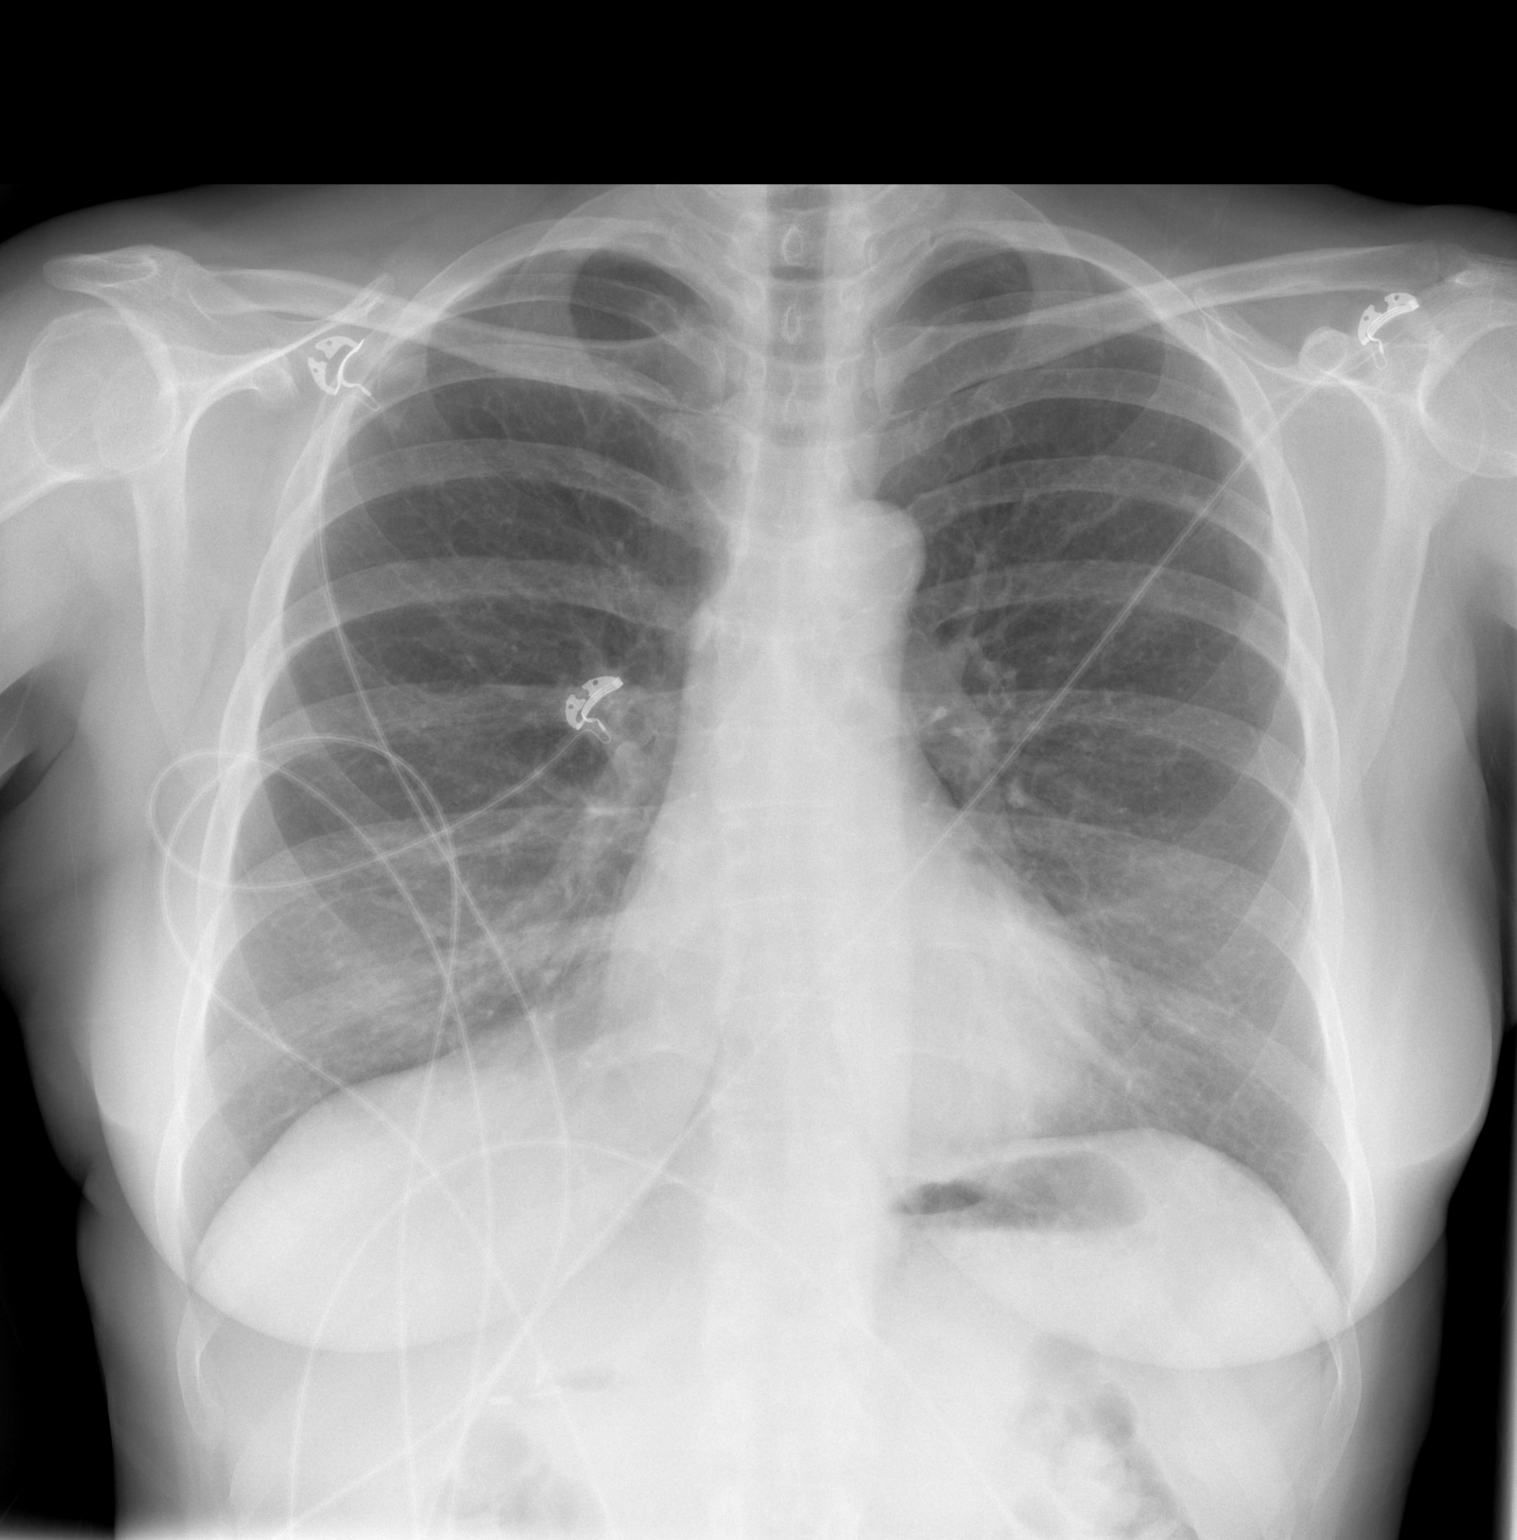

[w chest lat]
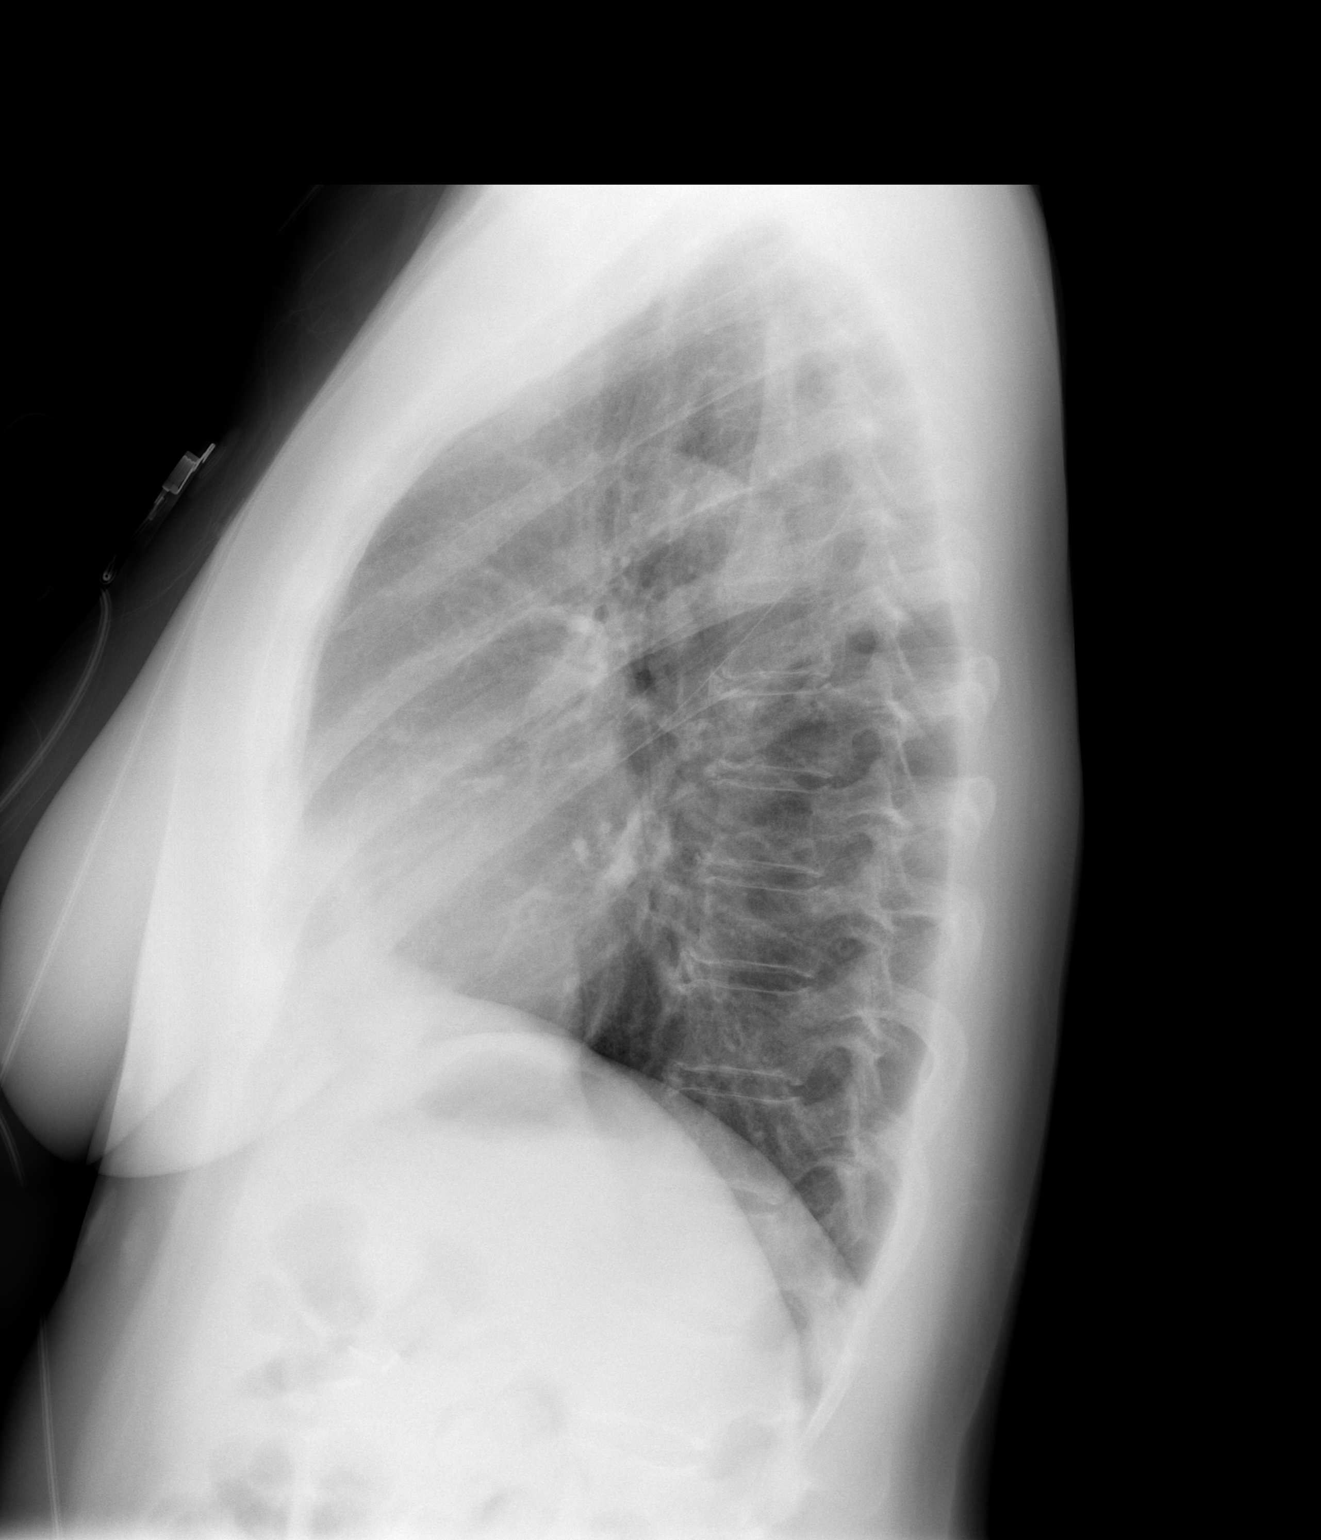

[2 of 2 positions shown; findings below may reference images not displayed]

FINDINGS: Normal cardiac silhouette and mediastinal contours. Minimal
bilateral infrahilar heterogeneous opacities favored to represent
atelectasis. No discrete focal airspace opacities. There is minimal
pleural parenchymal thickening about the bilateral major fissures.
No pleural effusion or pneumothorax. No evidence of edema. No acute
osseus abnormalities. Post cholecystectomy.
IMPRESSION: Minimal infrahilar atelectasis without acute cardiopulmonary
disease.

## 2016-11-13 ENCOUNTER — Other Ambulatory Visit: Payer: Self-pay | Admitting: Nurse Practitioner

## 2016-12-07 ENCOUNTER — Ambulatory Visit: Payer: Managed Care, Other (non HMO) | Admitting: Nurse Practitioner

## 2016-12-13 ENCOUNTER — Encounter: Payer: Self-pay | Admitting: Nurse Practitioner

## 2016-12-13 ENCOUNTER — Ambulatory Visit (INDEPENDENT_AMBULATORY_CARE_PROVIDER_SITE_OTHER): Payer: Managed Care, Other (non HMO) | Admitting: Nurse Practitioner

## 2016-12-13 VITALS — BP 140/90 | HR 78 | Ht 67.0 in | Wt 177.1 lb

## 2016-12-13 DIAGNOSIS — I2510 Atherosclerotic heart disease of native coronary artery without angina pectoris: Secondary | ICD-10-CM

## 2016-12-13 DIAGNOSIS — E7800 Pure hypercholesterolemia, unspecified: Secondary | ICD-10-CM | POA: Diagnosis not present

## 2016-12-13 DIAGNOSIS — I10 Essential (primary) hypertension: Secondary | ICD-10-CM

## 2016-12-13 DIAGNOSIS — Z720 Tobacco use: Secondary | ICD-10-CM

## 2016-12-13 MED ORDER — LISINOPRIL 20 MG PO TABS: 20.0000 mg | ORAL_TABLET | Freq: Two times a day (BID) | ORAL | 3 refills | Status: DC

## 2016-12-13 NOTE — Progress Notes (Signed)
CARDIOLOGY OFFICE NOTE  Date:  12/13/2016    Sara Trujillo Date of Birth: 05-07-1961 Medical Record #[id number]  PCP:  Birdena Jubilee, MD  Cardiologist:  Kirt Boys    Chief Complaint  Patient presents with  . Coronary Artery Disease    6 month check - seen for Dr. Excell Seltzer    History of Present Illness: Sara Trujillo is a 56 y.o. female who presents today for a 6 month check. Seen for Dr. Excell Seltzer.   She has history of tobacco abuse, CAD with inferior STEMI, HTN, hyperlipidemia, GERD, anxiety/depression and bipolar disorder.   She was admitted 3/19-3/23 of 2016 with an inferior STEMI treated with DES x 2 to distal and prox RCA.She was noted to have obstructive disease of the mCx and planned to have staged PCI. This was attempted 01/06/15 but there was difficulty crossing the lesion and resulted in a dissection and attempt at re-crossing into the true lumen was unsuccessful. Of note, she also has moderate non-obstructive disease of LAD.A 2D Echo 01/06/15: EF 50-55%, probable mild HK of inferior myocardium, grade 1 DD, mild MR. She was discharged with standard MI therapy and did well until 3/25, when she had severe emotional upset related to her dog's illness (the dog passed away).In that setting, she developed severe c/p and presented to [facility] where ECG was non-acute but troponin was elevated. Her Troponin with her MI was 59, down to 9 at discharge. It was 3.25 on 01/10/15 and falling to 2 by discharge the next day.She was then seen if f/u 01/17/15 by Ronie Spies and was doing OK. Lots of stress at home- "my husband is dying of cancer". She again presented to the ED 01/25/15 with chest discomfort "my chest just doesn't feel right". Her Troponin was negative then and she was re assured.   I have seen her back several times since - her husband had passed. She was smoking. No chest pain. She had lost weight. BP high and I increased her ACE. Depression has been an  ongoing issue.  Last seen back in August - cardiac status seemed to be ok - did need referral to Dr. Amanda Pea for her hand complaints.   Comes back today. Here alone. She has been diagnoses with RA - now on chronic Prednisone and on Methotrexate - weight is up. Still smoking "some" - does not really quantify. No chest pain fortunately. BP not at goal - says it is running like it is here today for the most part. Fasting today. Needs labs. No real exercise due to her joint issues. Not dizzy or lightheaded. She feels like she is doing ok for the most part.   Past Medical History:  Diagnosis Date  . Anxiety   . Bipolar disorder (HCC)   . CAD (coronary artery disease)    a. 12/2014 Inferior Inf STEMI/PCI: LM nl, LAD 60p, D1 50-60p, LCX nondom 47m, RI 80,m RCA dom, 100p (DES to prox and distal RCA), EF 60%;  b 12/2014 Staged attempted PCI of AV groove LCX with resultant dissection->aborted;  c. 12/2014 Echo: EF 50-55%, mod LVH, mild inf HK, Gr 1 DD, mild MR.  . Daily headache   . Depression   . GERD (gastroesophageal reflux disease)   . Hyperlipidemia   . Hypertension   . Tobacco abuse     Past Surgical History:  Procedure Laterality Date  . LAPAROSCOPIC CHOLECYSTECTOMY  1990's  . LEFT HEART CATHETERIZATION WITH CORONARY ANGIOGRAM N/A 01/04/2015   Procedure:  LEFT HEART CATHETERIZATION WITH CORONARY ANGIOGRAM;  Surgeon: Runell GessJonathan J Berry, MD; LAD 60%, D1 60%, CFX 90%, RI 80%, RCA 100%>>0% w/  2.25 x 12 mm Xience DES, EF 60%    . PERCUTANEOUS CORONARY STENT INTERVENTION (PCI-S)  01/04/2015   Procedure: PERCUTANEOUS CORONARY STENT INTERVENTION (PCI-S);  Surgeon: Runell GessJonathan J Berry, MD;  2.25 x 12 mm Xience DES to RCA    . PERCUTANEOUS CORONARY STENT INTERVENTION (PCI-S) N/A 01/06/2015   Procedure: PERCUTANEOUS CORONARY STENT INTERVENTION (PCI-S);  Surgeon: Runell GessJonathan J Berry, MD; RCA OK, CFX unsuccessfult PCI 2nd distal dissection      Medications: Current Outpatient Prescriptions  Medication Sig Dispense  Refill  . aspirin 81 MG tablet Take 1 tablet (81 mg total) by mouth daily.    Marland Kitchen. atorvastatin (LIPITOR) 80 MG tablet TAKE 1 TABLET DAILY 90 tablet 3  . BRILINTA 90 MG TABS tablet TAKE 1 TABLET TWICE A DAY 180 tablet 3  . DULoxetine (CYMBALTA) 60 MG capsule Take 60 mg by mouth.    . folic acid (FOLVITE) 1 MG tablet Take 1 mg by mouth daily.     Marland Kitchen. lisinopril (PRINIVIL,ZESTRIL) 20 MG tablet Take 1 tablet (20 mg total) by mouth 2 (two) times daily. 180 tablet 3  . Melatonin 10 MG CAPS Take 10 mg by mouth at bedtime.    . methotrexate (RHEUMATREX) 2.5 MG tablet Take 20.5 mg by mouth once a week.     . metoprolol tartrate (LOPRESSOR) 25 MG tablet TAKE ONE-HALF (1/2) TABLET TWICE A DAY 90 tablet 3  . nitroGLYCERIN (NITROSTAT) 0.4 MG SL tablet Place 1 tablet (0.4 mg total) under the tongue every 5 (five) minutes as needed for chest pain. 25 tablet 3  . predniSONE (DELTASONE) 10 MG tablet Take 5 mg by mouth 2 (two) times daily with a meal.     . traMADol (ULTRAM) 50 MG tablet Take 50 mg by mouth every 6 (six) hours as needed for severe pain.      No current facility-administered medications for this visit.     Allergies: Allergies  Allergen Reactions  . Penicillins   . Sulfa Antibiotics     Parents both allergic and was told not to take sulfa drugs    Social History: The patient  reports that she has been smoking Cigarettes.  She has a 21.75 pack-year smoking history. She has never used smokeless tobacco. She reports that she drinks alcohol. She reports that she does not use drugs.   Family History: The patient's family history includes Alzheimer's disease in her mother; Arrhythmia in her mother; COPD in her mother; Cancer - Colon in her father; Hyperlipidemia in her sister.   Review of Systems: Please see the history of present illness.   Otherwise, the review of systems is positive for none.   All other systems are reviewed and negative.   Physical Exam: VS:  BP 140/90   Pulse 78   Ht 5'  7" (1.702 m)   Wt 177 lb 1.9 oz (80.3 kg)   SpO2 98% Comment: at rest  BMI 27.74 kg/m  .  BMI Body mass index is 27.74 kg/m.  Wt Readings from Last 3 Encounters:  12/13/16 177 lb 1.9 oz (80.3 kg)  06/14/16 158 lb 6.4 oz (71.8 kg)  12/29/15 156 lb 3.2 oz (70.9 kg)   BP by me is 170/100  General: Pleasant. Well developed, well nourished and in no acute distress.   HEENT: Normal.  Neck: Supple, no JVD, carotid bruits, or  masses noted.  Cardiac: Regular rate and rhythm. No murmurs, rubs, or gallops. No edema.  Respiratory:  Lungs are clear to auscultation bilaterally with normal work of breathing.  GI: Soft and nontender.  MS: No deformity or atrophy. Gait and ROM intact.  Skin: Warm and dry. Color is normal.  Neuro:  Strength and sensation are intact and no gross focal deficits noted.  Psych: Alert, appropriate and with normal affect.   LABORATORY DATA:  EKG:  EKG is not ordered today.   Lab Results  Component Value Date   WBC 9.7 01/25/2015   HGB 13.6 01/25/2015   HCT 41.1 01/25/2015   PLT 352 01/25/2015   GLUCOSE 93 06/14/2016   CHOL 137 06/14/2016   TRIG 169 (H) 06/14/2016   HDL 32 (L) 06/14/2016   LDLCALC 71 06/14/2016   ALT 10 06/14/2016   AST 10 06/14/2016   NA 140 06/14/2016   K 4.4 06/14/2016   CL 110 06/14/2016   CREATININE 0.71 06/14/2016   BUN 19 06/14/2016   CO2 23 06/14/2016   INR 1.04 01/05/2015   HGBA1C 5.6 01/05/2015    BNP (last 3 results) No results for input(s): BNP in the last 8760 hours.  ProBNP (last 3 results) No results for input(s): PROBNP in the last 8760 hours.   Other Studies Reviewed Today:  Echo Study Conclusions from 12/2104  - Left ventricle: Wall thickness was increased in a pattern of moderate LVH. Systolic function was normal. The estimated ejection fraction was in the range of 50% to 55%. Probable mild hypokinesis of the inferior myocardium. Doppler parameters are consistent with abnormal left ventricular  relaxation (grade 1 diastolic dysfunction). - Mitral valve: There was mild regurgitation.  Assessment/Plan: 1. CAD with prior inferior STEMI treated with DES x 2 to distal and prox RCA back in March of 2016.She was noted to have obstructive disease of the mCx and planned to have staged PCI. This was attempted 01/06/15 but there was difficulty crossing the lesion and resulted in a dissection and attempt at re-crossing into the true lumen was unsuccessful. She has moderate non-obstructive disease of LAD also. She is managed medically. Currently with no symptoms but needs to work on CV risk factor modification. This has been an ongoing issue.   2. Significant situational stress - not discussed today.   3. HTN - Not at goal - Lisinopril increased to 20 mg BID.   4. HLD - on statin and needs labs - checking today.   5. Tobacco abuse - counseled about stopping but she is not ready.   6. Prior carotid bruit - needs doppler study repeated in March of 2019.   7. New dx of RA - on chronic prednisone and methotrexate.   8. Weight gain - discussed - needs to be able to be mobile - hopefully she will not require long term steroid therapy.    Current medicines are reviewed with the patient today.  The patient does not have concerns regarding medicines other than what has been noted above.  The following changes have been made:  See above.  Labs/ tests ordered today include:    Orders Placed This Encounter  Procedures  . Basic metabolic panel  . CBC  . Hepatic function panel  . Lipid panel     Disposition:   FU with me in 3 months.   Patient is agreeable to this plan and will call if any problems develop in the interim.   Signed: Norma Fredrickson, NP  12/13/2016 8:23 AM  [facility] [address] [place], [place]  [address] Phone: [phone] Fax: [phone]

## 2016-12-13 NOTE — Patient Instructions (Addendum)
We will be checking the following labs today - BMET, CBC, HPF and lipids   Medication Instructions:    Continue with your current medicines. BUT  I am increasing the Lisinopril to 20 mg to take twice a day - this has been sent to your mail order.     Testing/Procedures To Be Arranged:  N/A  Follow-Up:   See me in 3 months    Other Special Instructions:   Try to monitor BP for me. Goal is less than 130/80  Keep working on the smoking cessation.     If you need a refill on your cardiac medications before your next appointment, please call your pharmacy.   Call the [facility] office at [phone] if you have any questions, problems or concerns.

## 2016-12-14 ENCOUNTER — Telehealth: Payer: Self-pay | Admitting: Nurse Practitioner

## 2016-12-14 LAB — LIPID PANEL
Chol/HDL Ratio: 3.4 ratio units (ref 0.0–4.4)
Cholesterol, Total: 158 mg/dL (ref 100–199)
HDL: 46 mg/dL (ref >39)
LDL Calculated: 86 mg/dL (ref 0–99)
Triglycerides: 128 mg/dL (ref 0–149)
VLDL Cholesterol Cal: 26 mg/dL (ref 5–40)

## 2016-12-14 LAB — BASIC METABOLIC PANEL
BUN/Creatinine Ratio: 21 (ref 9–23)
BUN: 16 mg/dL (ref 6–24)
CO2: 20 mmol/L (ref 18–29)
Calcium: 9.4 mg/dL (ref 8.7–10.2)
Chloride: 102 mmol/L (ref 96–106)
Creatinine, Ser: 0.77 mg/dL (ref 0.57–1.00)
GFR calc Af Amer: 100 mL/min/{1.73_m2} (ref >59)
GFR calc non Af Amer: 87 mL/min/{1.73_m2} (ref >59)
Glucose: 87 mg/dL (ref 65–99)
Potassium: 4 mmol/L (ref 3.5–5.2)
Sodium: 140 mmol/L (ref 134–144)

## 2016-12-14 LAB — CBC
Hematocrit: 40.4 % (ref 34.0–46.6)
Hemoglobin: 13.7 g/dL (ref 11.1–15.9)
MCH: 33.7 pg — ABNORMAL HIGH (ref 26.6–33.0)
MCHC: 33.9 g/dL (ref 31.5–35.7)
MCV: 99 fL — ABNORMAL HIGH (ref 79–97)
Platelets: 328 10*3/uL (ref 150–379)
RBC: 4.07 x10E6/uL (ref 3.77–5.28)
RDW: 15 % (ref 12.3–15.4)
WBC: 16.2 10*3/uL — ABNORMAL HIGH (ref 3.4–10.8)

## 2016-12-14 LAB — HEPATIC FUNCTION PANEL
ALT: 26 IU/L (ref 0–32)
AST: 12 IU/L (ref 0–40)
Albumin: 4.1 g/dL (ref 3.5–5.5)
Alkaline Phosphatase: 85 IU/L (ref 39–117)
Bilirubin Total: 0.6 mg/dL (ref 0.0–1.2)
Bilirubin, Direct: 0.11 mg/dL (ref 0.00–0.40)
Total Protein: 6.5 g/dL (ref 6.0–8.5)

## 2016-12-14 NOTE — Telephone Encounter (Signed)
New Message     Pt is returning your call about her blood work results, please call

## 2017-02-24 ENCOUNTER — Encounter: Payer: Self-pay | Admitting: Nurse Practitioner

## 2017-03-18 ENCOUNTER — Ambulatory Visit: Payer: Managed Care, Other (non HMO) | Admitting: Nurse Practitioner

## 2017-04-13 ENCOUNTER — Encounter: Payer: Self-pay | Admitting: Nurse Practitioner

## 2017-04-13 ENCOUNTER — Telehealth: Payer: Self-pay | Admitting: *Deleted

## 2017-04-13 ENCOUNTER — Ambulatory Visit (INDEPENDENT_AMBULATORY_CARE_PROVIDER_SITE_OTHER): Payer: 59 | Admitting: Nurse Practitioner

## 2017-04-13 VITALS — BP 160/98 | HR 77 | Ht 67.5 in | Wt 179.1 lb

## 2017-04-13 DIAGNOSIS — I2510 Atherosclerotic heart disease of native coronary artery without angina pectoris: Secondary | ICD-10-CM | POA: Diagnosis not present

## 2017-04-13 DIAGNOSIS — E7800 Pure hypercholesterolemia, unspecified: Secondary | ICD-10-CM | POA: Diagnosis not present

## 2017-04-13 DIAGNOSIS — I10 Essential (primary) hypertension: Secondary | ICD-10-CM | POA: Diagnosis not present

## 2017-04-13 LAB — CBC
Hematocrit: 38.1 % (ref 34.0–46.6)
Hemoglobin: 12.7 g/dL (ref 11.1–15.9)
MCH: 31.5 pg (ref 26.6–33.0)
MCHC: 33.3 g/dL (ref 31.5–35.7)
MCV: 95 fL (ref 79–97)
Platelets: 321 10*3/uL (ref 150–379)
RBC: 4.03 x10E6/uL (ref 3.77–5.28)
RDW: 14.5 % (ref 12.3–15.4)
WBC: 9.5 10*3/uL (ref 3.4–10.8)

## 2017-04-13 LAB — LIPID PANEL
Chol/HDL Ratio: 3.2 ratio (ref 0.0–4.4)
Cholesterol, Total: 152 mg/dL (ref 100–199)
HDL: 47 mg/dL (ref >39)
LDL Calculated: 82 mg/dL (ref 0–99)
Triglycerides: 116 mg/dL (ref 0–149)
VLDL Cholesterol Cal: 23 mg/dL (ref 5–40)

## 2017-04-13 LAB — HEPATIC FUNCTION PANEL
ALT: 23 IU/L (ref 0–32)
AST: 14 IU/L (ref 0–40)
Albumin: 4.2 g/dL (ref 3.5–5.5)
Alkaline Phosphatase: 97 IU/L (ref 39–117)
Bilirubin Total: 0.7 mg/dL (ref 0.0–1.2)
Bilirubin, Direct: 0.15 mg/dL (ref 0.00–0.40)
Total Protein: 6.4 g/dL (ref 6.0–8.5)

## 2017-04-13 LAB — BASIC METABOLIC PANEL
BUN/Creatinine Ratio: 15 (ref 9–23)
BUN: 11 mg/dL (ref 6–24)
CO2: 20 mmol/L (ref 20–29)
Calcium: 9.7 mg/dL (ref 8.7–10.2)
Chloride: 107 mmol/L — ABNORMAL HIGH (ref 96–106)
Creatinine, Ser: 0.71 mg/dL (ref 0.57–1.00)
GFR calc Af Amer: 110 mL/min/{1.73_m2} (ref >59)
GFR calc non Af Amer: 96 mL/min/{1.73_m2} (ref >59)
Glucose: 92 mg/dL (ref 65–99)
Potassium: 4.1 mmol/L (ref 3.5–5.2)
Sodium: 141 mmol/L (ref 134–144)

## 2017-04-13 NOTE — Progress Notes (Signed)
CARDIOLOGY OFFICE NOTE  Date:  04/13/2017    Sara Trujillo Date of Birth: [birth year] Medical Record #[id number]  PCP:  Gillian Scarce, MD  Cardiologist:  Kirt Boys    Chief Complaint  Patient presents with  . Coronary Artery Disease    6 month check - seen for Dr. Excell Seltzer    History of Present Illness: Sara Trujillo is a 56 y.o. female who presents today for a 6 month check. Seen for Dr. Excell Seltzer.   She has history of tobacco abuse, CAD with inferior STEMI, HTN, hyperlipidemia, GERD, anxiety/depression and bipolar disorder.   She was admitted 3/19-3/23 of 2016 with an inferior STEMI treated with DES x 2 to distal and prox RCA.She was noted to have obstructive disease of the mCx and planned to have staged PCI. This was attempted 01/06/15 but there was difficulty crossing the lesion and resulted in a dissection and attempt at re-crossing into the true lumen was unsuccessful. Of note, she also has moderate non-obstructive disease of LAD.A 2D Echo 01/06/15: EF 50-55%, probable mild HK of inferior myocardium, grade 1 DD, mild MR. She was discharged with standard MI therapy and did well until 3/25, when she had severe emotional upset related to her dog's illness (the dog passed away).In that setting, she developed severe c/p and presented to [facility] where ECG was non-acute but troponin was elevated. Her Troponin with her MI was 59, down to 9 at discharge. It was 3.25 on 01/10/15 and falling to 2 by discharge the next day.She was then seen if f/u 01/17/15 by Ronie Spies and was doing OK. Lots of stress at home- "my husband is dying of cancer". She again presented to the ED 01/25/15 with chest discomfort "my chest just doesn't feel right". Her Troponin was negative then and she was re assured.   I have seen her back several times since - her husband had passed. She was smoking. No chest pain. She had lost weight then gained back. BP high and I increased her ACE.  Depression has been an ongoing issue.    Last seen by me back in February - had been diagnosed with RA and was on chronic Prednisone and on Methotrexate - still smoking. Not exercising. BP was high and her ACE was increased.   Comes back today. Here alone. BP high. Smoked about half an hour ago. Has had her medicines today. She is back walking about 5 times a week for 30 minutes. She is probably changing to a biologic for her RA - she sees Dr. Nickola Major. She does not have a history of CHF - this should be ok. No active chest pain. BP was better at rheumatology last week. She is not checking at home. Breathing is ok. Overall seems to be ok - mostly troubled by her RA and is anxious to get off Prednisone.   Past Medical History:  Diagnosis Date  . Anxiety   . Bipolar disorder (HCC)   . CAD (coronary artery disease)    a. 12/2014 Inferior Inf STEMI/PCI: LM nl, LAD 60p, D1 50-60p, LCX nondom 34m, RI 80,m RCA dom, 100p (DES to prox and distal RCA), EF 60%;  b 12/2014 Staged attempted PCI of AV groove LCX with resultant dissection->aborted;  c. 12/2014 Echo: EF 50-55%, mod LVH, mild inf HK, Gr 1 DD, mild MR.  . Daily headache   . Depression   . GERD (gastroesophageal reflux disease)   . Hyperlipidemia   . Hypertension   .  Tobacco abuse     Past Surgical History:  Procedure Laterality Date  . LAPAROSCOPIC CHOLECYSTECTOMY  1990's  . LEFT HEART CATHETERIZATION WITH CORONARY ANGIOGRAM N/A 01/04/2015   Procedure: LEFT HEART CATHETERIZATION WITH CORONARY ANGIOGRAM;  Surgeon: Runell GessJonathan J Berry, MD; LAD 60%, D1 60%, CFX 90%, RI 80%, RCA 100%>>0% w/  2.25 x 12 mm Xience DES, EF 60%    . PERCUTANEOUS CORONARY STENT INTERVENTION (PCI-S)  01/04/2015   Procedure: PERCUTANEOUS CORONARY STENT INTERVENTION (PCI-S);  Surgeon: Runell GessJonathan J Berry, MD;  2.25 x 12 mm Xience DES to RCA    . PERCUTANEOUS CORONARY STENT INTERVENTION (PCI-S) N/A 01/06/2015   Procedure: PERCUTANEOUS CORONARY STENT INTERVENTION (PCI-S);  Surgeon:  Runell GessJonathan J Berry, MD; RCA OK, CFX unsuccessfult PCI 2nd distal dissection      Medications: Current Meds  Medication Sig  . aspirin 81 MG tablet Take 1 tablet (81 mg total) by mouth daily.  Marland Kitchen. atorvastatin (LIPITOR) 80 MG tablet TAKE 1 TABLET DAILY  . BRILINTA 90 MG TABS tablet TAKE 1 TABLET TWICE A DAY  . dextrose 5 % SOLN 50 mL with methotrexate 25 MG/ML (PF) SOLN 40 mg/m2 Inject into the vein once a week. 1 ml weekly  . folic acid (FOLVITE) 1 MG tablet Take 2 mg by mouth daily.   Marland Kitchen. lisinopril (PRINIVIL,ZESTRIL) 20 MG tablet Take 1 tablet (20 mg total) by mouth 2 (two) times daily.  . Melatonin 10 MG CAPS Take 10 mg by mouth at bedtime.  . metoprolol tartrate (LOPRESSOR) 25 MG tablet TAKE ONE-HALF (1/2) TABLET TWICE A DAY  . nitroGLYCERIN (NITROSTAT) 0.4 MG SL tablet Place 1 tablet (0.4 mg total) under the tongue every 5 (five) minutes as needed for chest pain.  . predniSONE (DELTASONE) 10 MG tablet Take 5 mg by mouth 2 (two) times daily with a meal.   . traMADol (ULTRAM) 50 MG tablet Take 50 mg by mouth every 6 (six) hours as needed for severe pain.   . [DISCONTINUED] methotrexate (RHEUMATREX) 2.5 MG tablet Take 20.5 mg by mouth once a week.   . [DISCONTINUED] Methotrexate, Anti-Rheumatic, (RHEUMATREX PO) Take by mouth.     Allergies: Allergies  Allergen Reactions  . Penicillins   . Sulfa Antibiotics     Parents both allergic and was told not to take sulfa drugs    Social History: The patient  reports that she has been smoking Cigarettes.  She has a 21.75 pack-year smoking history. She has never used smokeless tobacco. She reports that she drinks alcohol. She reports that she does not use drugs.   Family History: The patient's family history includes Alzheimer's disease in her mother; Arrhythmia in her mother; COPD in her mother; Cancer - Colon in her father; Hyperlipidemia in her sister.   Review of Systems: Please see the history of present illness.   Otherwise, the review  of systems is positive for none.   All other systems are reviewed and negative.   Physical Exam: VS:  BP (!) 160/98 (BP Location: Left Arm, Patient Position: Sitting, Cuff Size: Normal)   Pulse 77   Ht 5' 7.5" (1.715 m)   Wt 179 lb 1.9 oz (81.2 kg)   SpO2 99% Comment: at rest  BMI 27.64 kg/m  .  BMI Body mass index is 27.64 kg/m.  Wt Readings from Last 3 Encounters:  04/13/17 179 lb 1.9 oz (81.2 kg)  12/13/16 177 lb 1.9 oz (80.3 kg)  06/14/16 158 lb 6.4 oz (71.8 kg)    General:  Pleasant. Well developed, well nourished and in no acute distress.  She has gained weight.  HEENT: Normal.  Neck: Supple, no JVD, carotid bruits, or masses noted.  Cardiac: Regular rate and rhythm. No murmurs, rubs, or gallops. No edema.  Respiratory:  Lungs are clear to auscultation bilaterally with normal work of breathing.  GI: Soft and nontender.  MS: No deformity or atrophy. Gait and ROM intact.  Skin: Warm and dry. Color is normal.  Neuro:  Strength and sensation are intact and no gross focal deficits noted.  Psych: Alert, appropriate and with normal affect.   LABORATORY DATA:  EKG:  EKG is not ordered today.  Lab Results  Component Value Date   WBC 16.2 (H) 12/13/2016   HGB 13.7 12/13/2016   HCT 40.4 12/13/2016   PLT 328 12/13/2016   GLUCOSE 87 12/13/2016   CHOL 158 12/13/2016   TRIG 128 12/13/2016   HDL 46 12/13/2016   LDLCALC 86 12/13/2016   ALT 26 12/13/2016   AST 12 12/13/2016   NA 140 12/13/2016   K 4.0 12/13/2016   CL 102 12/13/2016   CREATININE 0.77 12/13/2016   BUN 16 12/13/2016   CO2 20 12/13/2016   INR 1.04 01/05/2015   HGBA1C 5.6 01/05/2015     BNP (last 3 results) No results for input(s): BNP in the last 8760 hours.  ProBNP (last 3 results) No results for input(s): PROBNP in the last 8760 hours.   Other Studies Reviewed Today:  Echo Study Conclusions from 12/2104  - Left ventricle: Wall thickness was increased in a pattern of moderate LVH. Systolic  function was normal. The estimated ejection fraction was in the range of 50% to 55%. Probable mild hypokinesis of the inferior myocardium. Doppler parameters are consistent with abnormal left ventricular relaxation (grade 1 diastolic dysfunction). - Mitral valve: There was mild regurgitation.  Assessment/Plan: 1. CAD with prior inferior STEMI treated with DES x 2 to distal and prox RCA back in March of 2016.She was noted to have obstructive disease of the mCx and planned to have staged PCI. This was attempted 01/06/15 but there was difficulty crossing the lesion and resulted in a dissection and attempt at re-crossing into the true lumen was unsuccessful. She has moderate non-obstructive disease of LAD also. She is managed medically. Currently with no symptoms but still needs to work on CV risk factor modification. This has been an ongoing issue.   2. Significant situational stress - not discussed today.   3. HTN - BP recheck by me is 140/100. I have asked her to monitor at home and call us with an update in about 1 to 2 weeks - she may need additional medicines.   4. HLD - on statin and needs labs - checking today.   5. Tobacco abuse - counseled about stopping but she is not ready.   6. Prior carotid bruit - needs doppler study repeated in March of 2019.   7. New dx of RA - on chronic prednisone and methotrexate. Should be ok to use biologic.   8. Weight gain - she is trying. Hopefully will be getting off Prednisone.   Current medicines are reviewed with the patient today.  The patient does not have concerns regarding medicines other than what has been noted above.  The following changes have been made:  See above.  Labs/ tests ordered today include:    Orders Placed This Encounter  Procedures  . Basic metabolic panel  . CBC  . Hepatic function  panel  . Lipid panel     Disposition:   FU with me in 6 months. I offered for her to see Dr. Excell Seltzer - she wishes to  continue follow up with me.    Patient is agreeable to this plan and will call if any problems develop in the interim.   SignedNorma Fredrickson, NP  04/13/2017 8:31 AM  [facility] [address] [place], [place]  [address] Phone: [phone] Fax: [phone]

## 2017-04-13 NOTE — Patient Instructions (Addendum)
We will be checking the following labs today - BMET, CBC, lipids and HPF   Medication Instructions:    Continue with your current medicines.     Testing/Procedures To Be Arranged:  N/A  Follow-Up:   See me in 6 months    Other Special Instructions:   Monitor your BP over the next 1 to 2 weeks - call us with an update so we can decide about whether or not you need additional medicines.   Keep waling!  Keep working on smoking cessation.   I will send my note to Dr. Nickola MajorHawkes.    If you need a refill on your cardiac medications before your next appointment, please call your pharmacy.   Call the [facility] office at [phone] if you have any questions, problems or concerns.

## 2017-04-13 NOTE — Telephone Encounter (Signed)
Faxing paperwork due to pt's wanting to keep Dr. Zenovia JordanAngela Hawkes at Rheumatology in [place] @ [phone] and phone # is [phone].

## 2017-05-12 ENCOUNTER — Encounter: Payer: Self-pay | Admitting: *Deleted

## 2017-09-01 ENCOUNTER — Emergency Department (HOSPITAL_BASED_OUTPATIENT_CLINIC_OR_DEPARTMENT_OTHER)
Admission: EM | Admit: 2017-09-01 | Discharge: 2017-09-01 | Disposition: A | Discharge status: Home / Self Care | Payer: 59 | Attending: Emergency Medicine | Admitting: Emergency Medicine

## 2017-09-01 ENCOUNTER — Other Ambulatory Visit: Payer: Self-pay

## 2017-09-01 ENCOUNTER — Encounter (HOSPITAL_BASED_OUTPATIENT_CLINIC_OR_DEPARTMENT_OTHER): Payer: Self-pay | Admitting: *Deleted

## 2017-09-01 ENCOUNTER — Telehealth: Payer: Self-pay | Admitting: Nurse Practitioner

## 2017-09-01 DIAGNOSIS — Z79899 Other long term (current) drug therapy: Secondary | ICD-10-CM | POA: Diagnosis not present

## 2017-09-01 DIAGNOSIS — I2510 Atherosclerotic heart disease of native coronary artery without angina pectoris: Secondary | ICD-10-CM | POA: Insufficient documentation

## 2017-09-01 DIAGNOSIS — F17210 Nicotine dependence, cigarettes, uncomplicated: Secondary | ICD-10-CM | POA: Diagnosis not present

## 2017-09-01 DIAGNOSIS — I10 Essential (primary) hypertension: Secondary | ICD-10-CM | POA: Diagnosis not present

## 2017-09-01 DIAGNOSIS — R220 Localized swelling, mass and lump, head: Secondary | ICD-10-CM | POA: Diagnosis present

## 2017-09-01 DIAGNOSIS — Z7982 Long term (current) use of aspirin: Secondary | ICD-10-CM | POA: Diagnosis not present

## 2017-09-01 DIAGNOSIS — T783XXA Angioneurotic edema, initial encounter: Secondary | ICD-10-CM | POA: Insufficient documentation

## 2017-09-01 HISTORY — DX: Unspecified osteoarthritis, unspecified site: M19.90

## 2017-09-01 MED ORDER — PREDNISONE 20 MG PO TABS: ORAL_TABLET | ORAL | 0 refills | Status: DC

## 2017-09-01 MED ORDER — METHYLPREDNISOLONE SODIUM SUCC 125 MG IJ SOLR
125.0000 mg | Freq: Once | INTRAMUSCULAR | Status: AC
  Administered 2017-09-01: 125 mg via INTRAVENOUS
  Filled 2017-09-01: qty 2

## 2017-09-01 MED ORDER — FAMOTIDINE IN NACL 20-0.9 MG/50ML-% IV SOLN
20.0000 mg | Freq: Once | INTRAVENOUS | Status: AC
  Administered 2017-09-01: 20 mg via INTRAVENOUS
  Filled 2017-09-01: qty 50

## 2017-09-01 MED ORDER — DIPHENHYDRAMINE HCL 50 MG/ML IJ SOLN
25.0000 mg | Freq: Once | INTRAMUSCULAR | Status: AC
Start: 2017-09-01 — End: 2017-09-01
  Administered 2017-09-01: 25 mg via INTRAVENOUS
  Filled 2017-09-01: qty 1

## 2017-09-01 NOTE — Telephone Encounter (Signed)
Spoke with patient regarding Lisinopril allergy.  Her tongue is swelling and had to go to the ER early this AM.  Will forward to NigerLori and Danielle.  Patient verbalized understanding.

## 2017-09-01 NOTE — Progress Notes (Signed)
Spoke with Dr. Excell Seltzerooper regarding patient's visit to the ED on 11/15 with tongue swelling.  He recommended stopping Lisinopril, monitoring BP over the weekend and call us if it elevates above 140/90.  Amlodipine 5 mg daily would then be started.

## 2017-09-01 NOTE — Telephone Encounter (Signed)
Sara Trujillo his calling because she has had a allergic reaction to the Lisinopril and is wanting something to replace the lisinopril . Please call

## 2017-09-01 NOTE — ED Triage Notes (Addendum)
C/o tongue swelling onset approx 20 minutes pta  No diff swallowing or breathing

## 2017-09-01 NOTE — Discharge Instructions (Addendum)
Stop taking lisinopril and follow-up with primary doctor for further instructions on medications and recheck of blood pressure.

## 2017-09-01 NOTE — Telephone Encounter (Signed)
Will ask Duwayne HeckDanielle to call her on Monday and see how she is doing and what her readings are.

## 2017-09-01 NOTE — Telephone Encounter (Signed)
Spoke with patient and asked her to monitor her BP over the next couple of days.  Informed her that if it is above 140/90, please let us know and we will start her on Amlodipine 5 mg daily.

## 2017-09-01 NOTE — Telephone Encounter (Signed)
Spoke with Dr. Excell Seltzerooper regarding patient's visit to the ER after tongue swelling early this morning.  He recommended stopping Lisinopril and monitoring BP over the weekend.  If BP elevated above 140/90, we would then start Amlodipine 5 mg daily.  Will inform patient.

## 2017-09-01 NOTE — ED Provider Notes (Signed)
[facility] EMERGENCY DEPARTMENT Provider Note   CSN: [id number] Arrival date & time: 09/01/17  0348     History   Chief Complaint Chief Complaint  Patient presents with  . Oral Swelling    HPI Sara Trujillo is a 56 y.o. female.  She presents to the emergency department for evaluation of tongue swelling.  She reports that she woke up 20 minutes ago and noticed that the right side of her tongue was swollen.  She is swallowing without difficulty.  No shortness of breath.  Patient does take Prinivil, has been on it for several years.  She has not had similar reactions in the past.  No other new medications or exposures.      Past Medical History:  Diagnosis Date  . Anxiety   . Arthritis   . Bipolar disorder (HCC)   . CAD (coronary artery disease)    a. 12/2014 Inferior Inf STEMI/PCI: LM nl, LAD 60p, D1 50-60p, LCX nondom 6m, RI 80,m RCA dom, 100p (DES to prox and distal RCA), EF 60%;  b 12/2014 Staged attempted PCI of AV groove LCX with resultant dissection->aborted;  c. 12/2014 Echo: EF 50-55%, mod LVH, mild inf HK, Gr 1 DD, mild MR.  . Daily headache   . Depression   . GERD (gastroesophageal reflux disease)   . Hyperlipidemia   . Hypertension   . Tobacco abuse     Patient Active Problem List   Diagnosis Date Noted  . Carotid bruit 02/25/2015  . Anxiety   . Depression   . Bipolar disorder (HCC)   . GERD (gastroesophageal reflux disease)   . Midsternal chest pain 01/10/2015  . CAD S/P RCA DES x 2 01/04/15,  unsucessful CFX PCI 01/06/15 01/10/2015  . Hypokalemia 01/07/2015  . IFG (impaired fasting glucose) 01/07/2015  . Dyslipidemia   . Essential hypertension   . Tobacco abuse 01/04/2015  . STEMI of inferior wall- 01/04/15 01/04/2015    Past Surgical History:  Procedure Laterality Date  . LAPAROSCOPIC CHOLECYSTECTOMY  1990's  . LEFT HEART CATHETERIZATION WITH CORONARY ANGIOGRAM N/A 01/04/2015   Procedure: LEFT HEART CATHETERIZATION WITH CORONARY  ANGIOGRAM;  Surgeon: Runell Gess, MD; LAD 60%, D1 60%, CFX 90%, RI 80%, RCA 100%>>0% w/  2.25 x 12 mm Xience DES, EF 60%    . PERCUTANEOUS CORONARY STENT INTERVENTION (PCI-S)  01/04/2015   Procedure: PERCUTANEOUS CORONARY STENT INTERVENTION (PCI-S);  Surgeon: Runell Gess, MD;  2.25 x 12 mm Xience DES to RCA    . PERCUTANEOUS CORONARY STENT INTERVENTION (PCI-S) N/A 01/06/2015   Procedure: PERCUTANEOUS CORONARY STENT INTERVENTION (PCI-S);  Surgeon: Runell Gess, MD; RCA OK, CFX unsuccessfult PCI 2nd distal dissection     OB History    No data available       Home Medications    Prior to Admission medications   Medication Sig Start Date End Date Taking? Authorizing Provider  aspirin 81 MG tablet Take 1 tablet (81 mg total) by mouth daily. 01/08/15   Barrett, Joline Salt, PA-C  atorvastatin (LIPITOR) 80 MG tablet TAKE 1 TABLET DAILY 11/15/16   Rosalio Macadamia, NP  BRILINTA 90 MG TABS tablet TAKE 1 TABLET TWICE A DAY 11/15/16   Rosalio Macadamia, NP  dextrose 5 % SOLN 50 mL with methotrexate 25 MG/ML (PF) SOLN 40 mg/m2 Inject into the vein once a week. 1 ml weekly    [provider]  folic acid (FOLVITE) 1 MG tablet Take 2 mg by mouth daily.  09/02/16 09/02/17  [provider]  lisinopril (PRINIVIL,ZESTRIL) 20 MG tablet Take 1 tablet (20 mg total) by mouth 2 (two) times daily. 12/13/16   Rosalio MacadamiaGerhardt, Lori C, NP  Melatonin 10 MG CAPS Take 10 mg by mouth at bedtime.    [provider]  metoprolol tartrate (LOPRESSOR) 25 MG tablet TAKE ONE-HALF (1/2) TABLET TWICE A DAY 11/15/16   Rosalio MacadamiaGerhardt, Lori C, NP  nitroGLYCERIN (NITROSTAT) 0.4 MG SL tablet Place 1 tablet (0.4 mg total) under the tongue every 5 (five) minutes as needed for chest pain. 12/29/15   Rosalio MacadamiaGerhardt, Lori C, NP  predniSONE (DELTASONE) 10 MG tablet Take 5 mg by mouth 2 (two) times daily with a meal.  10/26/16   [provider]  predniSONE (DELTASONE) 20 MG tablet 3 tabs po daily x 3 days, then 2.5 tabs x 3  days, then 2 tabs x 3 days, then 1.5 tab x 3 days, then 1 tabs x 3 days 09/01/17   Gilda CreasePollina, Hailley Byers J, MD  traMADol (ULTRAM) 50 MG tablet Take 50 mg by mouth every 6 (six) hours as needed for severe pain.  09/02/16 09/02/17  [provider]    Family History Family History  Problem Relation Age of Onset  . COPD Mother   . Arrhythmia Mother   . Alzheimer's disease Mother   . Osteoarthritis Mother   . Cancer - Colon Father   . Hyperlipidemia Sister     Social History Social History   Tobacco Use  . Smoking status: Current Every Day Smoker    Packs/day: 0.75    Years: 29.00    Pack years: 21.75    Types: Cigarettes  . Smokeless tobacco: Never Used  Substance Use Topics  . Alcohol use: Yes    Alcohol/week: 0.0 oz    Comment: 01/07/2015 "might have a drink a few times/yr"  . Drug use: No     Allergies   Penicillins and Sulfa antibiotics   Review of Systems Review of Systems  HENT: Negative for drooling and trouble swallowing.   Respiratory: Negative for shortness of breath.   All other systems reviewed and are negative.    Physical Exam Updated Vital Signs BP (!) 158/119 (BP Location: Right Arm)   Pulse 100   Temp 98.4 F (36.9 C) (Oral)   Resp 16   Ht 5\' 7"  (1.702 m)   Wt 81.6 kg (180 lb)   SpO2 100%   BMI 28.19 kg/m   Physical Exam  Constitutional: She is oriented to person, place, and time. She appears well-developed and well-nourished. No distress.  HENT:  Head: Normocephalic and atraumatic.  Right Ear: Hearing normal.  Left Ear: Hearing normal.  Nose: Nose normal.  Mouth/Throat: Oropharynx is clear and moist and mucous membranes are normal.  Moderate swelling of the right side of tongue without any obstruction of the airway  Eyes: Conjunctivae and EOM are normal. Pupils are equal, round, and reactive to light.  Neck: Normal range of motion. Neck supple.  Cardiovascular: Regular rhythm, S1 normal and S2 normal. Exam reveals no gallop and  no friction rub.  No murmur heard. Pulmonary/Chest: Effort normal and breath sounds normal. No respiratory distress. She exhibits no tenderness.  Abdominal: Soft. Normal appearance and bowel sounds are normal. There is no hepatosplenomegaly. There is no tenderness. There is no rebound, no guarding, no tenderness at McBurney's point and negative Murphy's sign. No hernia.  Musculoskeletal: Normal range of motion.  Neurological: She is alert and oriented to person, place,  and time. She has normal strength. No cranial nerve deficit or sensory deficit. Coordination normal. GCS eye subscore is 4. GCS verbal subscore is 5. GCS motor subscore is 6.  Skin: Skin is warm, dry and intact. No rash noted. No cyanosis.  Psychiatric: She has a normal mood and affect. Her speech is normal and behavior is normal. Thought content normal.  Nursing note and vitals reviewed.    ED Treatments / Results  Labs (all labs ordered are listed, but only abnormal results are displayed) Labs Reviewed - No data to display  EKG  EKG Interpretation None       Radiology No results found.  Procedures Procedures (including critical care time)  Medications Ordered in ED Medications  methylPREDNISolone sodium succinate (SOLU-MEDROL) 125 mg/2 mL injection 125 mg (125 mg Intravenous Given 09/01/17 0429)  famotidine (PEPCID) IVPB 20 mg premix (20 mg Intravenous New Bag/Given 09/01/17 0428)  diphenhydrAMINE (BENADRYL) injection 25 mg (25 mg Intravenous Given 09/01/17 0429)     Initial Impression / Assessment and Plan / ED Course  I have reviewed the triage vital signs and the nursing notes.  Pertinent labs & imaging results that were available during my care of the patient were reviewed by me and considered in my medical decision making (see chart for details).     Patient presents with tongue swelling.  Patient awakened with right-sided tongue swelling this evening.  She does take lisinopril which is likely the  cause of this angioedema.  Patient was treated with Benadryl, Pepcid, Solu-Medrol and monitored for 2 hours.  Symptoms are improving.  She has not had any vital sign abnormality, no evidence of any airway involvement.  Patient will stop lisinopril and follow-up with primary doctor for further blood pressure control.  Final Clinical Impressions(s) / ED Diagnoses   Final diagnoses:  Angioedema, initial encounter    ED Discharge Orders        Ordered    predniSONE (DELTASONE) 20 MG tablet     09/01/17 [id number]       Gilda CreasePollina, Kyah Buesing J, MD 09/01/17 [phone]

## 2017-09-01 NOTE — ED Notes (Signed)
Pt states tongue swelling is a lot less

## 2017-09-02 MED ORDER — AMLODIPINE BESYLATE 5 MG PO TABS: 5.0000 mg | ORAL_TABLET | Freq: Every day | ORAL | 3 refills | Status: DC

## 2017-09-02 NOTE — Telephone Encounter (Signed)
Patient stated her BP was at 153/103. Informed patient that Per Dr. Excell Seltzerooper, if her BP was above 140/90 to start amlodipine 5 mg by mouth daily. Also, informed patient of Kary KosLori Gerhardt's message. Informed patient to keep track of her BP's over the weekend and Duwayne HeckDanielle will be calling her on Monday. Informed patient to call our office if she as any other concerns between now and Monday. Patient verbalized understanding.

## 2017-09-02 NOTE — Telephone Encounter (Signed)
F/u Call .Marland Kitchen. Patient states that she was instructed to call back if her BP went over the 140/90 mark. Patient states that her BP went over the mark.  Please call patient mobile # ending in [phone]

## 2017-09-05 NOTE — Telephone Encounter (Signed)
S/w pt stated is fine taking the new medication, denies light headedness, dizzy, sob.  Stated does not have readings with pt but stated in the 130's over 90's all weekend.  Stated Lawson FiscalLori typically likes bp 135/85.  Will call back pt after Lori advise's.

## 2017-09-05 NOTE — Telephone Encounter (Signed)
Pt is aware to stay on Amlodipine (5mg ) daily and when pt comes in January to see Lawson FiscalLori pt is advised to bring bp readings.

## 2017-09-05 NOTE — Telephone Encounter (Signed)
Add Norvasc 5 mg a day.  Needs follow up with me in 4 to 6 weeks with BP diary please.

## 2017-10-25 ENCOUNTER — Ambulatory Visit: Payer: 59 | Admitting: Nurse Practitioner

## 2017-11-08 ENCOUNTER — Other Ambulatory Visit: Payer: Self-pay | Admitting: Nurse Practitioner

## 2017-11-16 ENCOUNTER — Ambulatory Visit: Payer: 59 | Admitting: Nurse Practitioner

## 2017-11-29 NOTE — Progress Notes (Signed)
CARDIOLOGY OFFICE NOTE  Date:  11/30/2017    Sara MccreedyMelinda Bernabei Date of Birth: 1960-12-25 Medical Record #[id number]  PCP:  Sara Trujillo, Robyn K, MD  Cardiologist:  [facility]    Chief Complaint  Patient presents with  . Coronary Artery Disease    Follow up visit - seen for Dr. Excell Seltzerooper    History of Present Illness: Sara Trujillo is a 57 y.o. female who presents today for a follow up visit. Seen for Dr. Excell Seltzerooper. She has primarily followed with me.   She has history of tobacco abuse, CAD with inferior STEMI, HTN, hyperlipidemia, GERD, anxiety/depression and bipolar disorder.   She was admitted 3/19-3/23 of 2016 with an inferior STEMI treated with DES x 2 to distal and prox RCA.She was noted to have obstructive disease of the mCx and planned to have staged PCI. This was attempted 01/06/15 but there was difficulty crossing the lesion and resulted in a dissection and attempt at re-crossing into the true lumen was unsuccessful. Of note, she also has moderate non-obstructive disease of LAD.A 2D Echo 01/06/15: EF 50-55%, probable mild HK of inferior myocardium, grade 1 DD, mild MR. She was discharged with standard MI therapy and did well until 3/25, when she had severe emotional upset related to her dog's illness (the dog passed away).In that setting, she developed severe c/p and [facility] ECG was non-acute but troponin was elevated. Her Troponin with her MI was 59, down to 9 at discharge. It was 3.25 on 01/10/15 and falling to 2 by discharge the next day.She was then seen if f/u 01/17/15 by Sara Trujillo and was doing OK. Lots of stress at home- "my husband is dying of cancer". She again presented to the ED 01/25/15 with chest discomfort "my chest just doesn't feel right". Her Troponin was negative then and she was re assured.   I have seen her back several times since - her husband had passed. She was smoking. No chest pain. She had lost weight then gained back.  BP high and I increased her ACE. Depression has been an ongoing issue. Diagnosed with RA and was on chronic prednisone and on Methotrexate.  Last seen by me back in June and cardiac status was ok but BP was up and she was asked to monitor.   Had a spell of angioedema back in November - off Lisinopril. Placed on Norvasc.   Comes back today. Here alone. She feels like she is doing well. No chest pain. Breathing is ok. Did have a winter cold that lasted for about 3 weeks - now resolved. BP a little higher at home than here. BP per rheumatology note was ok. She has never had her cuff checked. She is fasting today. Still smoking - not ready to stop. On lower doses of Prednisone now. No real concerns today.   Past Medical History:  Diagnosis Date  . Anxiety   . Arthritis   . Bipolar disorder (HCC)   . CAD (coronary artery disease)    a. 12/2014 Inferior Inf STEMI/PCI: LM nl, LAD 60p, D1 50-60p, LCX nondom 3960m, RI 80,m RCA dom, 100p (DES to prox and distal RCA), EF 60%;  b 12/2014 Staged attempted PCI of AV groove LCX with resultant dissection->aborted;  c. 12/2014 Echo: EF 50-55%, mod LVH, mild inf HK, Gr 1 DD, mild MR.  . Daily headache   . Depression   . GERD (gastroesophageal reflux disease)   . Hyperlipidemia   . Hypertension   .  Tobacco abuse     Past Surgical History:  Procedure Laterality Date  . LAPAROSCOPIC CHOLECYSTECTOMY  1990's  . LEFT HEART CATHETERIZATION WITH CORONARY ANGIOGRAM N/A 01/04/2015   Procedure: LEFT HEART CATHETERIZATION WITH CORONARY ANGIOGRAM;  Surgeon: Runell Gess, MD; LAD 60%, D1 60%, CFX 90%, RI 80%, RCA 100%>>0% w/  2.25 x 12 mm Xience DES, EF 60%    . PERCUTANEOUS CORONARY STENT INTERVENTION (PCI-S)  01/04/2015   Procedure: PERCUTANEOUS CORONARY STENT INTERVENTION (PCI-S);  Surgeon: Runell Gess, MD;  2.25 x 12 mm Xience DES to RCA    . PERCUTANEOUS CORONARY STENT INTERVENTION (PCI-S) N/A 01/06/2015   Procedure: PERCUTANEOUS CORONARY STENT INTERVENTION  (PCI-S);  Surgeon: Runell Gess, MD; RCA OK, CFX unsuccessfult PCI 2nd distal dissection      Medications: Current Meds  Medication Sig  . amLODipine (NORVASC) 5 MG tablet Take 1 tablet (5 mg total) daily by mouth.  Marland Kitchen aspirin 81 MG tablet Take 1 tablet (81 mg total) by mouth daily.  Marland Kitchen atorvastatin (LIPITOR) 80 MG tablet TAKE 1 TABLET DAILY  . BRILINTA 90 MG TABS tablet TAKE 1 TABLET TWICE A DAY  . dextrose 5 % SOLN 50 mL with methotrexate 25 MG/ML (PF) SOLN 40 mg/m2 Inject into the vein once a week. 1 ml weekly  . folic acid (FOLVITE) 1 MG tablet Take 1 mg by mouth 2 (two) times daily.   Marland Kitchen HUMIRA PEN 40 MG/0.4ML PNKT Inject 1 Dose into the skin once a week. Every other week  . hydroxychloroquine (PLAQUENIL) 200 MG tablet take one tablet twice a day with food or milk  . Melatonin 10 MG CAPS Take 10 mg by mouth at bedtime.  . metoprolol tartrate (LOPRESSOR) 25 MG tablet TAKE ONE-HALF (1/2) TABLET TWICE A DAY  . nitroGLYCERIN (NITROSTAT) 0.4 MG SL tablet Place 1 tablet (0.4 mg total) under the tongue every 5 (five) minutes as needed for chest pain.  . predniSONE (DELTASONE) 10 MG tablet Take 5 mg by mouth 2 (two) times daily with a meal.   . traMADol (ULTRAM) 50 MG tablet take one tablet 4 times as needed for wrist pain/RA     Allergies: Allergies  Allergen Reactions  . Lisinopril Swelling  . Penicillins   . Sulfa Antibiotics     Parents both allergic and was told not to take sulfa drugs    Social History: The patient  reports that she has been smoking cigarettes.  She has a 21.75 pack-year smoking history. she has never used smokeless tobacco. She reports that she drinks alcohol. She reports that she does not use drugs.   Family History: The patient's family history includes Alzheimer's disease in her mother; Arrhythmia in her mother; COPD in her mother; Cancer - Colon in her father; Hyperlipidemia in her sister; Osteoarthritis in her mother.   Review of Systems: Please see  the history of present illness.   Otherwise, the review of systems is positive for none.   All other systems are reviewed and negative.   Physical Exam: VS:  BP 118/86 (BP Location: Left Arm, Patient Position: Sitting, Cuff Size: Normal)   Pulse 72   Ht 5\' 7"  (1.702 m)   Wt 180 lb 12.8 oz (82 kg)   BMI 28.32 kg/m  .  BMI Body mass index is 28.32 kg/m.  Wt Readings from Last 3 Encounters:  11/30/17 180 lb 12.8 oz (82 kg)  09/01/17 180 lb (81.6 kg)  04/13/17 179 lb 1.9 oz (81.2 kg)  General: Pleasant. She looks older than her stated age. Alert and in no acute distress.   HEENT: Normal.  Neck: Supple, no JVD, carotid bruits, or masses noted.  Cardiac: Regular rate and rhythm. No murmurs, rubs, or gallops. No edema.  Respiratory:  Lungs are clear to auscultation bilaterally with normal work of breathing.  GI: Soft and nontender.  MS: No deformity or atrophy. Gait and ROM intact.  Skin: Warm and dry. Color is normal.  Neuro:  Strength and sensation are intact and no gross focal deficits noted.  Psych: Alert, appropriate and with normal affect.   LABORATORY DATA:  EKG:  EKG is ordered today. This demonstrates NSR with nonspecific ST & T wave changes - unchanged.  Lab Results  Component Value Date   WBC 9.5 04/13/2017   HGB 12.7 04/13/2017   HCT 38.1 04/13/2017   PLT 321 04/13/2017   GLUCOSE 92 04/13/2017   CHOL 152 04/13/2017   TRIG 116 04/13/2017   HDL 47 04/13/2017   LDLCALC 82 04/13/2017   ALT 23 04/13/2017   AST 14 04/13/2017   NA 141 04/13/2017   K 4.1 04/13/2017   CL 107 (H) 04/13/2017   CREATININE 0.71 04/13/2017   BUN 11 04/13/2017   CO2 20 04/13/2017   INR 1.04 01/05/2015   HGBA1C 5.6 01/05/2015     BNP (last 3 results) No results for input(s): BNP in the last 8760 hours.  ProBNP (last 3 results) No results for input(s): PROBNP in the last 8760 hours.   Other Studies Reviewed Today:  Echo Study Conclusions from 12/2104  - Left ventricle: Wall  thickness was increased in a pattern of moderate LVH. Systolic function was normal. The estimated ejection fraction was in the range of 50% to 55%. Probable mild hypokinesis of the inferior myocardium. Doppler parameters are consistent with abnormal left ventricular relaxation (grade 1 diastolic dysfunction). - Mitral valve: There was mild regurgitation.  Assessment/Plan: 1. CAD with prior inferior STEMI treated with DES x 2 to distal and prox RCA back in March of 2016.She was noted to have obstructive disease of the mCx and planned to have staged PCI. This was attempted 01/06/15 but there was difficulty crossing the lesion and resulted in a dissection and attempt at re-crossing into the true lumen was unsuccessful. She has moderate non-obstructive disease of LAD also.   She continues to do ok. No symptoms of angina. Needs CV risk factor modification but this has been difficult for her to embrace. No changes made today.   2. Significant situational stress - not discussed today but she seems more upbeat today.    3. HTN - BP here looks ok. Will check her cuff on return. She is ACE allergic due to angioedema. No changes made today.   4. HLD - on statin - she is fasting today.   5. Tobacco abuse - she is not ready to stop.   6. Prior carotid bruit - needs doppler study repeated in March of 2019.   7. RA - on chronic prednisone and methotrexate.    Current medicines are reviewed with the patient today.  The patient does not have concerns regarding medicines other than what has been noted above.  The following changes have been made:  See above.  Labs/ tests ordered today include:    Orders Placed This Encounter  Procedures  . Basic metabolic panel  . CBC  . Hepatic function panel  . Lipid panel  . EKG 12-Lead  Disposition:   FU with me in 6 months.   Patient is agreeable to this plan and will call if any problems develop in the interim.   SignedNorma Fredrickson, NP  11/30/2017 9:05 AM  [facility] [address] [place], [place]  [address] Phone: [phone] Fax: [phone]

## 2017-11-30 ENCOUNTER — Encounter: Payer: Self-pay | Admitting: Nurse Practitioner

## 2017-11-30 ENCOUNTER — Ambulatory Visit (INDEPENDENT_AMBULATORY_CARE_PROVIDER_SITE_OTHER): Payer: 59 | Admitting: Nurse Practitioner

## 2017-11-30 VITALS — BP 118/86 | HR 72 | Ht 67.0 in | Wt 180.8 lb

## 2017-11-30 DIAGNOSIS — I6529 Occlusion and stenosis of unspecified carotid artery: Secondary | ICD-10-CM

## 2017-11-30 DIAGNOSIS — E7800 Pure hypercholesterolemia, unspecified: Secondary | ICD-10-CM

## 2017-11-30 DIAGNOSIS — I2510 Atherosclerotic heart disease of native coronary artery without angina pectoris: Secondary | ICD-10-CM

## 2017-11-30 DIAGNOSIS — I259 Chronic ischemic heart disease, unspecified: Secondary | ICD-10-CM

## 2017-11-30 DIAGNOSIS — I10 Essential (primary) hypertension: Secondary | ICD-10-CM

## 2017-11-30 NOTE — Patient Instructions (Addendum)
We will be checking the following labs today - BMET, CBC, HPF and lipids   Medication Instructions:    Continue with your current medicines.     Testing/Procedures To Be Arranged:  Carotid dopplers in March of 2019  Follow-Up:   See me in 6 months with fasting labs  Bring your BP cuff with you and we will check it    Other Special Instructions:   N/A    If you need a refill on your cardiac medications before your next appointment, please call your pharmacy.   Call the [facility] office at [phone] if you have any questions, problems or concerns.

## 2017-12-01 LAB — HEPATIC FUNCTION PANEL
ALT: 27 IU/L (ref 0–32)
AST: 16 IU/L (ref 0–40)
Albumin: 4.3 g/dL (ref 3.5–5.5)
Alkaline Phosphatase: 107 IU/L (ref 39–117)
Bilirubin Total: 0.6 mg/dL (ref 0.0–1.2)
Bilirubin, Direct: 0.15 mg/dL (ref 0.00–0.40)
Total Protein: 7 g/dL (ref 6.0–8.5)

## 2017-12-01 LAB — LIPID PANEL
Chol/HDL Ratio: 2.8 ratio (ref 0.0–4.4)
Cholesterol, Total: 139 mg/dL (ref 100–199)
HDL: 50 mg/dL (ref >39)
LDL Calculated: 68 mg/dL (ref 0–99)
Triglycerides: 106 mg/dL (ref 0–149)
VLDL Cholesterol Cal: 21 mg/dL (ref 5–40)

## 2017-12-01 LAB — CBC
Hematocrit: 41 % (ref 34.0–46.6)
Hemoglobin: 14.3 g/dL (ref 11.1–15.9)
MCH: 32.6 pg (ref 26.6–33.0)
MCHC: 34.9 g/dL (ref 31.5–35.7)
MCV: 93 fL (ref 79–97)
Platelets: 278 10*3/uL (ref 150–379)
RBC: 4.39 x10E6/uL (ref 3.77–5.28)
RDW: 14 % (ref 12.3–15.4)
WBC: 9.3 10*3/uL (ref 3.4–10.8)

## 2017-12-01 LAB — BASIC METABOLIC PANEL
BUN/Creatinine Ratio: 16 (ref 9–23)
BUN: 13 mg/dL (ref 6–24)
CO2: 20 mmol/L (ref 20–29)
Calcium: 9.8 mg/dL (ref 8.7–10.2)
Chloride: 105 mmol/L (ref 96–106)
Creatinine, Ser: 0.82 mg/dL (ref 0.57–1.00)
GFR calc Af Amer: 92 mL/min/{1.73_m2} (ref >59)
GFR calc non Af Amer: 80 mL/min/{1.73_m2} (ref >59)
Glucose: 85 mg/dL (ref 65–99)
Potassium: 4 mmol/L (ref 3.5–5.2)
Sodium: 141 mmol/L (ref 134–144)

## 2017-12-27 ENCOUNTER — Ambulatory Visit (HOSPITAL_COMMUNITY): Admission: RE | Admit: 2017-12-27 | Payer: 59 | Source: Ambulatory Visit

## 2017-12-27 DIAGNOSIS — R0989 Other specified symptoms and signs involving the circulatory and respiratory systems: Secondary | ICD-10-CM

## 2017-12-28 ENCOUNTER — Encounter (HOSPITAL_COMMUNITY): Payer: Self-pay | Admitting: Nurse Practitioner

## 2018-01-16 ENCOUNTER — Encounter (HOSPITAL_COMMUNITY): Payer: 59

## 2018-02-06 ENCOUNTER — Ambulatory Visit (HOSPITAL_COMMUNITY)
Admission: RE | Admit: 2018-02-06 | Discharge: 2018-02-06 | Disposition: A | Discharge status: Home / Self Care | Payer: 59 | Source: Ambulatory Visit | Attending: Cardiology | Admitting: Cardiology

## 2018-02-06 DIAGNOSIS — F17200 Nicotine dependence, unspecified, uncomplicated: Secondary | ICD-10-CM | POA: Diagnosis not present

## 2018-02-06 DIAGNOSIS — I6529 Occlusion and stenosis of unspecified carotid artery: Secondary | ICD-10-CM | POA: Diagnosis present

## 2018-02-06 DIAGNOSIS — I10 Essential (primary) hypertension: Secondary | ICD-10-CM | POA: Insufficient documentation

## 2018-02-06 DIAGNOSIS — I6523 Occlusion and stenosis of bilateral carotid arteries: Secondary | ICD-10-CM | POA: Diagnosis not present

## 2018-02-06 DIAGNOSIS — E785 Hyperlipidemia, unspecified: Secondary | ICD-10-CM | POA: Diagnosis not present

## 2018-02-06 DIAGNOSIS — I2510 Atherosclerotic heart disease of native coronary artery without angina pectoris: Secondary | ICD-10-CM | POA: Insufficient documentation

## 2018-05-23 ENCOUNTER — Encounter: Payer: Self-pay | Admitting: Nurse Practitioner

## 2018-05-23 ENCOUNTER — Ambulatory Visit (INDEPENDENT_AMBULATORY_CARE_PROVIDER_SITE_OTHER): Payer: 59 | Admitting: Nurse Practitioner

## 2018-05-23 VITALS — BP 130/84 | HR 77 | Ht 67.5 in | Wt 182.0 lb

## 2018-05-23 DIAGNOSIS — E7800 Pure hypercholesterolemia, unspecified: Secondary | ICD-10-CM | POA: Diagnosis not present

## 2018-05-23 DIAGNOSIS — I10 Essential (primary) hypertension: Secondary | ICD-10-CM | POA: Diagnosis not present

## 2018-05-23 DIAGNOSIS — I259 Chronic ischemic heart disease, unspecified: Secondary | ICD-10-CM | POA: Diagnosis not present

## 2018-05-23 DIAGNOSIS — Z720 Tobacco use: Secondary | ICD-10-CM | POA: Diagnosis not present

## 2018-05-23 LAB — BASIC METABOLIC PANEL
BUN/Creatinine Ratio: 14 (ref 9–23)
BUN: 12 mg/dL (ref 6–24)
CO2: 19 mmol/L — ABNORMAL LOW (ref 20–29)
Calcium: 9.1 mg/dL (ref 8.7–10.2)
Chloride: 108 mmol/L — ABNORMAL HIGH (ref 96–106)
Creatinine, Ser: 0.86 mg/dL (ref 0.57–1.00)
GFR calc Af Amer: 87 mL/min/{1.73_m2} (ref >59)
GFR calc non Af Amer: 75 mL/min/{1.73_m2} (ref >59)
Glucose: 78 mg/dL (ref 65–99)
Potassium: 4.5 mmol/L (ref 3.5–5.2)
Sodium: 141 mmol/L (ref 134–144)

## 2018-05-23 LAB — CBC
Hematocrit: 38.6 % (ref 34.0–46.6)
Hemoglobin: 13.3 g/dL (ref 11.1–15.9)
MCH: 33.1 pg — ABNORMAL HIGH (ref 26.6–33.0)
MCHC: 34.5 g/dL (ref 31.5–35.7)
MCV: 96 fL (ref 79–97)
Platelets: 269 10*3/uL (ref 150–450)
RBC: 4.02 x10E6/uL (ref 3.77–5.28)
RDW: 14.6 % (ref 12.3–15.4)
WBC: 9.2 10*3/uL (ref 3.4–10.8)

## 2018-05-23 LAB — LIPID PANEL
Chol/HDL Ratio: 2.5 ratio (ref 0.0–4.4)
Cholesterol, Total: 134 mg/dL (ref 100–199)
HDL: 54 mg/dL (ref >39)
LDL Calculated: 66 mg/dL (ref 0–99)
Triglycerides: 70 mg/dL (ref 0–149)
VLDL Cholesterol Cal: 14 mg/dL (ref 5–40)

## 2018-05-23 LAB — HEPATIC FUNCTION PANEL
ALT: 26 IU/L (ref 0–32)
AST: 20 IU/L (ref 0–40)
Albumin: 4.1 g/dL (ref 3.5–5.5)
Alkaline Phosphatase: 92 IU/L (ref 39–117)
Bilirubin Total: 0.3 mg/dL (ref 0.0–1.2)
Bilirubin, Direct: 0.09 mg/dL (ref 0.00–0.40)
Total Protein: 6.1 g/dL (ref 6.0–8.5)

## 2018-05-23 NOTE — Progress Notes (Signed)
CARDIOLOGY OFFICE NOTE  Date:  05/23/2018    Sara Trujillo Date of Birth: Feb 23, 1961 Medical Record #[id number]  PCP:  Gillian ScarceZanard, Robyn K, MD  Cardiologist:  [facility]    Chief Complaint  Patient presents with  . Coronary Artery Disease    6 month check - seen for Dr. Excell Seltzerooper    History of Present Illness: Sara Trujillo is a 57 y.o. female who presents today for a 6 month check. Seen for Dr. Excell Seltzerooper. She has primarily followed with me.   She has history of tobacco abuse, CAD with inferior STEMI, HTN, hyperlipidemia, GERD, anxiety/depression and bipolar disorder. She has had angioedema with ACE inhibitors.   She was admitted 3/19-3/23 of 2016 with an inferior STEMI treated with DES x 2 to distal and prox RCA.She was noted to have obstructive disease of the mCx and planned to have staged PCI. This was attempted 01/06/15 but there was difficulty crossing the lesion and resulted in a dissection and attempt at re-crossing into the true lumen was unsuccessful. Of note, she also has moderate non-obstructive disease of LAD.A 2D Echo 01/06/15: EF 50-55%, probable mild HK of inferior myocardium, grade 1 DD, mild MR. She was discharged with standard MI therapy and did well until 3/25, when she had severe emotional upset related to her dog's illness (the dog passed away).In that setting, she developed severe c/p and [facility] where ECG was non-acute but troponin was elevated. Her Troponin with her MI was 59, down to 9 at discharge. It was 3.25 on 01/10/15 and falling to 2 by discharge the next day.She was then seen if f/u 01/17/15 by Ronie Spiesayna Dunn and was doing OK. Lots of stress at home- "my husband is dying of cancer". She again presented to the ED 01/25/15 with chest discomfort "my chest just doesn't feel right". Her Troponin was negative then and she was reassured.   I have seen her back several times since - her husband had passed. She continues to smoke. She had  lost weight then gained back. BP high and I increased her ACE. Depression has been an ongoing issue. Diagnosed with RA and was on chronic prednisone and on Plaquenil.  Last seen by me back in February of 2019 and her cardiac status was ok.   Comes back today. Here alone. Mostly limited by her bilateral wrist pain. Otherwise she is doing pretty well. No chest pain. Breathing is ok. She and her cigarettes are "friendly" still. Work is going well. She feels like she is doing ok from our standpoint. Rheumatology is trying to lower her dose of Prednisone.   Past Medical History:  Diagnosis Date  . Anxiety   . Arthritis   . Bipolar disorder (HCC)   . CAD (coronary artery disease)    a. 12/2014 Inferior Inf STEMI/PCI: LM nl, LAD 60p, D1 50-60p, LCX nondom 2958m, RI 80,m RCA dom, 100p (DES to prox and distal RCA), EF 60%;  b 12/2014 Staged attempted PCI of AV groove LCX with resultant dissection->aborted;  c. 12/2014 Echo: EF 50-55%, mod LVH, mild inf HK, Gr 1 DD, mild MR.  . Daily headache   . Depression   . GERD (gastroesophageal reflux disease)   . Hyperlipidemia   . Hypertension   . Tobacco abuse     Past Surgical History:  Procedure Laterality Date  . LAPAROSCOPIC CHOLECYSTECTOMY  1990's  . LEFT HEART CATHETERIZATION WITH CORONARY ANGIOGRAM N/A 01/04/2015   Procedure: LEFT HEART CATHETERIZATION WITH CORONARY  ANGIOGRAM;  Surgeon: Runell Gess, MD; LAD 60%, D1 60%, CFX 90%, RI 80%, RCA 100%>>0% w/  2.25 x 12 mm Xience DES, EF 60%    . PERCUTANEOUS CORONARY STENT INTERVENTION (PCI-S)  01/04/2015   Procedure: PERCUTANEOUS CORONARY STENT INTERVENTION (PCI-S);  Surgeon: Runell Gess, MD;  2.25 x 12 mm Xience DES to RCA    . PERCUTANEOUS CORONARY STENT INTERVENTION (PCI-S) N/A 01/06/2015   Procedure: PERCUTANEOUS CORONARY STENT INTERVENTION (PCI-S);  Surgeon: Runell Gess, MD; RCA OK, CFX unsuccessfult PCI 2nd distal dissection      Medications: Current Meds  Medication Sig  . aspirin  81 MG tablet Take 1 tablet (81 mg total) by mouth daily.  Marland Kitchen atorvastatin (LIPITOR) 80 MG tablet TAKE 1 TABLET DAILY  . BRILINTA 90 MG TABS tablet TAKE 1 TABLET TWICE A DAY  . dextrose 5 % SOLN 50 mL with methotrexate 25 MG/ML (PF) SOLN 40 mg/m2 Inject into the vein once a week. 1 ml weekly  . folic acid (FOLVITE) 1 MG tablet Take 1 mg by mouth 2 (two) times daily.   Marland Kitchen HUMIRA PEN 40 MG/0.4ML PNKT Inject 1 Dose into the skin once a week. Every other week  . hydroxychloroquine (PLAQUENIL) 200 MG tablet take one tablet twice a day with food or milk  . Melatonin 10 MG CAPS Take 10 mg by mouth at bedtime.  . metoprolol tartrate (LOPRESSOR) 25 MG tablet TAKE ONE-HALF (1/2) TABLET TWICE A DAY  . nitroGLYCERIN (NITROSTAT) 0.4 MG SL tablet Place 1 tablet (0.4 mg total) under the tongue every 5 (five) minutes as needed for chest pain.  . predniSONE (DELTASONE) 5 MG tablet Take 2.5 mg by mouth 2 (two) times daily with a meal.  . traMADol (ULTRAM) 50 MG tablet take one tablet 4 times as needed for wrist pain/RA  . [DISCONTINUED] amLODipine (NORVASC) 5 MG tablet Take 1 tablet (5 mg total) daily by mouth.  . [DISCONTINUED] predniSONE (DELTASONE) 10 MG tablet Take 5 mg by mouth 2 (two) times daily with a meal.      Allergies: Allergies  Allergen Reactions  . Lisinopril Swelling  . Penicillins   . Sulfa Antibiotics     Parents both allergic and was told not to take sulfa drugs    Social History: The patient  reports that she has been smoking cigarettes.  She has a 21.75 pack-year smoking history. She has never used smokeless tobacco. She reports that she drinks alcohol. She reports that she does not use drugs.   Family History: The patient's family history includes Alzheimer's disease in her mother; Arrhythmia in her mother; COPD in her mother; Cancer - Colon in her father; Hyperlipidemia in her sister; Osteoarthritis in her mother.   Review of Systems: Please see the history of present illness.    Otherwise, the review of systems is positive for none.   All other systems are reviewed and negative.   Physical Exam: VS:  BP 130/84 (BP Location: Left Arm, Patient Position: Sitting, Cuff Size: Normal)   Pulse 77   Ht 5' 7.5" (1.715 m)   Wt 182 lb (82.6 kg)   SpO2 98% Comment: at rest  BMI 28.08 kg/m  .  BMI Body mass index is 28.08 kg/m.  Wt Readings from Last 3 Encounters:  05/23/18 182 lb (82.6 kg)  11/30/17 180 lb 12.8 oz (82 kg)  09/01/17 180 lb (81.6 kg)    General: Pleasant. Alert and in no acute distress.   HEENT: Normal.  Neck: Supple, no JVD, carotid bruits, or masses noted.  Cardiac: Regular rate and rhythm. No murmurs, rubs, or gallops. No edema.  Respiratory:  Lungs are clear to auscultation bilaterally with normal work of breathing.  GI: Soft and nontender.  MS: No deformity or atrophy. Gait and ROM intact.  Skin: Warm and dry. Color is normal.  Neuro:  Strength and sensation are intact and no gross focal deficits noted.  Psych: Alert, appropriate and with normal affect.   LABORATORY DATA:  EKG:  EKG is not ordered today.   Lab Results  Component Value Date   WBC 9.3 11/30/2017   HGB 14.3 11/30/2017   HCT 41.0 11/30/2017   PLT 278 11/30/2017   GLUCOSE 85 11/30/2017   CHOL 139 11/30/2017   TRIG 106 11/30/2017   HDL 50 11/30/2017   LDLCALC 68 11/30/2017   ALT 27 11/30/2017   AST 16 11/30/2017   NA 141 11/30/2017   K 4.0 11/30/2017   CL 105 11/30/2017   CREATININE 0.82 11/30/2017   BUN 13 11/30/2017   CO2 20 11/30/2017   INR 1.04 01/05/2015   HGBA1C 5.6 01/05/2015     BNP (last 3 results) No results for input(s): BNP in the last 8760 hours.  ProBNP (last 3 results) No results for input(s): PROBNP in the last 8760 hours.   Other Studies Reviewed Today:  Echo Study Conclusions from 12/2104  - Left ventricle: Wall thickness was increased in a pattern of moderate LVH. Systolic function was normal. The estimated ejection fraction was  in the range of 50% to 55%. Probable mild hypokinesis of the inferior myocardium. Doppler parameters are consistent with abnormal left ventricular relaxation (grade 1 diastolic dysfunction). - Mitral valve: There was mild regurgitation.  Assessment/Plan:  1. CAD with prior inferior STEMI treated with DES x 2 to distal and prox RCA back in March of 2016.She was noted to have obstructive disease of the mCx and planned to have staged PCI. This was attempted 01/06/15 but there was difficulty crossing the lesion and resulted in a dissection and attempt at re-crossing into the true lumen was unsuccessful. She has moderate non-obstructive disease of LAD also.   She is doing ok clinically. She has not had chest pain. CV risk factor modification recommended with smoking cessation/diet/exercise/weight loss. Fortunately, she seems to be holding her own.   2. HTN -BP looks good today - no changes made - she has had angioedema with ACE.   3. HLD - on statin therapy - needs labs today. Will repeat in 6 months as well.  4. Tobacco abuse - she is not ready to stop  5. Carotid disease - study from 01/2018 with 1 to 39% on the right and 40 to 59% on the left - will need repeat study in April of 2020.   6. RA - on chronic prednisone and Plaquenil - followed by Rheumatology  Current medicines are reviewed with the patient today.  The patient does not have concerns regarding medicines other than what has been noted above.  The following changes have been made:  See above.  Labs/ tests ordered today include:    Orders Placed This Encounter  Procedures  . Basic metabolic panel  . CBC  . Lipid panel  . Hepatic function panel     Disposition:   FU with me in 6 months with labs on return.    Patient is agreeable to this plan and will call if any problems develop in the interim.  SignedNorma Fredrickson, NP  05/23/2018 8:20 AM  [facility] [address] [place], [place]  [address] Phone: [phone] Fax: [phone]

## 2018-05-23 NOTE — Patient Instructions (Addendum)
We will be checking the following labs today - BMET, CBC, HPF and lipids   Medication Instructions:    Continue with your current medicines.     Testing/Procedures To Be Arranged:  N/A  Follow-Up:   See me in 6 months with fasting labs    Other Special Instructions:   N/A    If you need a refill on your cardiac medications before your next appointment, please call your pharmacy.   Call the [facility] office at [phone] if you have any questions, problems or concerns.      

## 2018-07-05 ENCOUNTER — Encounter: Payer: Self-pay | Admitting: *Deleted

## 2018-07-11 ENCOUNTER — Other Ambulatory Visit: Payer: Self-pay | Admitting: *Deleted

## 2018-07-11 DIAGNOSIS — R0989 Other specified symptoms and signs involving the circulatory and respiratory systems: Secondary | ICD-10-CM

## 2018-08-12 ENCOUNTER — Other Ambulatory Visit: Payer: Self-pay | Admitting: Cardiovascular Disease

## 2018-08-28 ENCOUNTER — Telehealth: Payer: Self-pay | Admitting: *Deleted

## 2018-08-28 ENCOUNTER — Other Ambulatory Visit: Payer: Self-pay | Admitting: *Deleted

## 2018-08-28 MED ORDER — AMLODIPINE BESYLATE 5 MG PO TABS: 5.0000 mg | ORAL_TABLET | Freq: Every day | ORAL | 3 refills | Status: DC

## 2018-08-28 NOTE — Telephone Encounter (Signed)
Patient called and wanted to know why the request to refill the amlodipine was denied. This is not listed on her current med list, looks like it was removed at her last office visit with a reason of error. If appropriate, she would like for this to be sent to [facility]. Please advise. Thanks, MI

## 2018-08-28 NOTE — Telephone Encounter (Signed)
Would refill the Norvasc.   Romina Divirgilio.

## 2018-11-05 ENCOUNTER — Other Ambulatory Visit: Payer: Self-pay | Admitting: Nurse Practitioner

## 2018-11-28 ENCOUNTER — Ambulatory Visit: Payer: 59 | Admitting: Nurse Practitioner

## 2018-12-05 ENCOUNTER — Ambulatory Visit (INDEPENDENT_AMBULATORY_CARE_PROVIDER_SITE_OTHER): Payer: 59 | Admitting: Nurse Practitioner

## 2018-12-05 ENCOUNTER — Encounter: Payer: Self-pay | Admitting: Nurse Practitioner

## 2018-12-05 VITALS — BP 140/90 | HR 67 | Ht 67.5 in | Wt 180.1 lb

## 2018-12-05 DIAGNOSIS — Z720 Tobacco use: Secondary | ICD-10-CM | POA: Diagnosis not present

## 2018-12-05 DIAGNOSIS — I259 Chronic ischemic heart disease, unspecified: Secondary | ICD-10-CM | POA: Diagnosis not present

## 2018-12-05 DIAGNOSIS — E7800 Pure hypercholesterolemia, unspecified: Secondary | ICD-10-CM

## 2018-12-05 DIAGNOSIS — I10 Essential (primary) hypertension: Secondary | ICD-10-CM | POA: Diagnosis not present

## 2018-12-05 DIAGNOSIS — I6523 Occlusion and stenosis of bilateral carotid arteries: Secondary | ICD-10-CM

## 2018-12-05 MED ORDER — AMLODIPINE BESYLATE 5 MG PO TABS: 5.0000 mg | ORAL_TABLET | Freq: Every day | ORAL | 3 refills | Status: DC

## 2018-12-05 NOTE — Progress Notes (Signed)
CARDIOLOGY OFFICE NOTE  Date:  12/05/2018    Sara Trujillo Date of Birth: 18-Jan-1961 Medical Record #[id number]  PCP:  Gillian ScarceZanard, Robyn K, MD  Cardiologist:  [facility] [employer]    Chief Complaint  Patient presents with  . Coronary Artery Disease    6 month check - seen for Dr. Excell Seltzerooper    History of Present Illness: Sara MccreedyMelinda Fariss is a 58 y.o. female who presents today for a follow up visit.  Seen for Dr. Excell Seltzerooper.She has primarily followed with me.  She has history of tobacco abuse, CAD with inferior STEMI, HTN, hyperlipidemia, GERD, anxiety/depression and bipolar disorder. She has had angioedema with ACE inhibitors.   She was admitted 3/19-3/23 of 2016 with an inferior STEMI treated with DES x 2 to distal and prox RCA.She was noted to have obstructive disease of the mCx and planned to have staged PCI. This was attempted 01/06/15 but there was difficulty crossing the lesion and resulted in a dissection and attempt at re-crossing into the true lumen was unsuccessful. Of note, she also has moderate non-obstructive disease of LAD.A 2D Echo 01/06/15: EF 50-55%, probable mild HK of inferior myocardium, grade 1 DD, mild MR. She was discharged with standard MI therapy and did well until 3/25, when she had severe emotional upset related to her dog's illness (the dog passed away).In that setting, she developed severe c/p and [facility] ECG was non-acute but troponin was elevated. Her Troponin with her MI was 59, down to 9 at discharge. It was 3.25 on 01/10/15 and falling to 2 by discharge the next day.She was then seen if f/u 01/17/15 by Ronie Spiesayna Dunn and was doing OK. Lots of stress at home- "my husband is dying of cancer". She again presented to the ED 01/25/15 with chest discomfort "my chest just doesn't feel right". Her Troponin was negative then and she was reassured.   I have seen her back several times since - her husband had passed. She continues to smoke. She  had lost weight then gained back. BP high and I increased her ACE. Depression has been an ongoing issue. Diagnosed with RA and was on chronicprednisone and on Plaquenil.Last seen by me back in August of 2019 and her cardiac status was ok.   Comes back today. Here alone. She feels like she is doing ok. No chest pain. Breathing is stable. Still smoking. Not checking her BP - it is a little high here today. She eats out - typically has one meal a day - probably getting too much salt. Not dizzy or lightheaded. More issues with her RA in her right hand.   Past Medical History:  Diagnosis Date  . Anxiety   . Arthritis   . Bipolar disorder (HCC)   . CAD (coronary artery disease)    a. 12/2014 Inferior Inf STEMI/PCI: LM nl, LAD 60p, D1 50-60p, LCX nondom 6490m, RI 80,m RCA dom, 100p (DES to prox and distal RCA), EF 60%;  b 12/2014 Staged attempted PCI of AV groove LCX with resultant dissection->aborted;  c. 12/2014 Echo: EF 50-55%, mod LVH, mild inf HK, Gr 1 DD, mild MR.  . Daily headache   . Depression   . GERD (gastroesophageal reflux disease)   . Hyperlipidemia   . Hypertension   . Tobacco abuse     Past Surgical History:  Procedure Laterality Date  . LAPAROSCOPIC CHOLECYSTECTOMY  1990's  . LEFT HEART CATHETERIZATION WITH CORONARY ANGIOGRAM N/A 01/04/2015   Procedure: LEFT HEART  CATHETERIZATION WITH CORONARY ANGIOGRAM;  Surgeon: Runell Gess, MD; LAD 60%, D1 60%, CFX 90%, RI 80%, RCA 100%>>0% w/  2.25 x 12 mm Xience DES, EF 60%    . PERCUTANEOUS CORONARY STENT INTERVENTION (PCI-S)  01/04/2015   Procedure: PERCUTANEOUS CORONARY STENT INTERVENTION (PCI-S);  Surgeon: Runell Gess, MD;  2.25 x 12 mm Xience DES to RCA    . PERCUTANEOUS CORONARY STENT INTERVENTION (PCI-S) N/A 01/06/2015   Procedure: PERCUTANEOUS CORONARY STENT INTERVENTION (PCI-S);  Surgeon: Runell Gess, MD; RCA OK, CFX unsuccessfult PCI 2nd distal dissection      Medications: Current Meds  Medication Sig  .  amLODipine (NORVASC) 5 MG tablet Take 1 tablet (5 mg total) by mouth daily.  Marland Kitchen aspirin 81 MG tablet Take 1 tablet (81 mg total) by mouth daily.  Marland Kitchen atorvastatin (LIPITOR) 80 MG tablet TAKE 1 TABLET DAILY  . BRILINTA 90 MG TABS tablet TAKE 1 TABLET TWICE A DAY  . dextrose 5 % SOLN 50 mL with methotrexate 25 MG/ML (PF) SOLN 40 mg/m2 Inject into the vein once a week. 1 ml weekly  . folic acid (FOLVITE) 1 MG tablet Take 1 mg by mouth 2 (two) times daily.   Marland Kitchen HUMIRA PEN 40 MG/0.4ML PNKT Inject 1 Dose into the skin once a week. Every other week  . hydroxychloroquine (PLAQUENIL) 200 MG tablet take one tablet twice a day with food or milk  . Melatonin 10 MG CAPS Take 10 mg by mouth at bedtime.  . metoprolol tartrate (LOPRESSOR) 25 MG tablet Take 0.5 tablets (12.5 mg total) by mouth 2 (two) times daily.  . nitroGLYCERIN (NITROSTAT) 0.4 MG SL tablet Place 1 tablet (0.4 mg total) under the tongue every 5 (five) minutes as needed for chest pain.  . predniSONE (DELTASONE) 5 MG tablet Take 2.5 mg by mouth 2 (two) times daily with a meal.  . traMADol (ULTRAM) 50 MG tablet take one tablet 4 times as needed for wrist pain/RA  . [DISCONTINUED] amLODipine (NORVASC) 5 MG tablet Take 1 tablet (5 mg total) by mouth daily.     Allergies: Allergies  Allergen Reactions  . Lisinopril Swelling  . Penicillins   . Sulfa Antibiotics     Parents both allergic and was told not to take sulfa drugs    Social History: The patient  reports that she has been smoking cigarettes. She has a 21.75 pack-year smoking history. She has never used smokeless tobacco. She reports current alcohol use. She reports that she does not use drugs.   Family History: The patient's family history includes Alzheimer's disease in her mother; Arrhythmia in her mother; COPD in her mother; Cancer - Colon in her father; Hyperlipidemia in her sister; Osteoarthritis in her mother.   Review of Systems: Please see the history of present illness.    Otherwise, the review of systems is positive for none.   All other systems are reviewed and negative.   Physical Exam: VS:  BP 140/90 (BP Location: Left Arm, Patient Position: Sitting, Cuff Size: Normal)   Pulse 67   Ht 5' 7.5" (1.715 m)   Wt 180 lb 1.9 oz (81.7 kg)   SpO2 100% Comment: at rest  BMI 27.79 kg/m  .  BMI Body mass index is 27.79 kg/m.  Wt Readings from Last 3 Encounters:  12/05/18 180 lb 1.9 oz (81.7 kg)  05/23/18 182 lb (82.6 kg)  11/30/17 180 lb 12.8 oz (82 kg)    General: Pleasant. Well developed, well nourished and  in no acute distress.   HEENT: Normal.  Neck: Supple, no JVD, carotid bruits, or masses noted.  Cardiac: Regular rate and rhythm. No murmurs, rubs, or gallops. No edema.  Respiratory:  Lungs are clear to auscultation bilaterally with normal work of breathing.  GI: Soft and nontender.  MS: No deformity or atrophy. Gait and ROM intact.  Skin: Warm and dry. Color is normal.  Neuro:  Strength and sensation are intact and no gross focal deficits noted.  Psych: Alert, appropriate and with normal affect.   LABORATORY DATA:  EKG:  EKG is not ordered today.  Lab Results  Component Value Date   WBC 9.2 05/23/2018   HGB 13.3 05/23/2018   HCT 38.6 05/23/2018   PLT 269 05/23/2018   GLUCOSE 78 05/23/2018   CHOL 134 05/23/2018   TRIG 70 05/23/2018   HDL 54 05/23/2018   LDLCALC 66 05/23/2018   ALT 26 05/23/2018   AST 20 05/23/2018   NA 141 05/23/2018   K 4.5 05/23/2018   CL 108 (H) 05/23/2018   CREATININE 0.86 05/23/2018   BUN 12 05/23/2018   CO2 19 (L) 05/23/2018   INR 1.04 01/05/2015   HGBA1C 5.6 01/05/2015     BNP (last 3 results) No results for input(s): BNP in the last 8760 hours.  ProBNP (last 3 results) No results for input(s): PROBNP in the last 8760 hours.   Other Studies Reviewed Today:   Assessment/Plan:  1. CAD with prior inferior STEMI treated with DES x 2 to distal and prox RCA back in March of 2016.She was noted to  have obstructive disease of the mCx and planned to have staged PCI. This was attempted 01/06/15 but there was difficulty crossing the lesion and resulted in a dissection and attempt at re-crossing into the true lumen was unsuccessful. She has moderate non-obstructive disease of LAD also.   She has been followed clinically since her initial event and has done ok. No active symptoms. CV risk factor modification encouraged. Lab today.   2. HTN -BP recheck by me is 140/80. I have asked her to monitor at home. Probably getting too much salt as well. She has had prior angioedema with ACE. Norvasc refilled to her mail order today.   3. HLD - she remains on statin - needs lab today.   4. Tobacco abuse -she is not ready to stop and admits this. We did discuss lung cancer CT screening if she would like.   5. Carotid disease - study from 01/2018 with 1 to 39% on the right and 40 to 59% on the left - will need repeat study after April of 2020.   6. RA - on chronic prednisone and Plaquenil - followed by Rheumatology  Current medicines are reviewed with the patient today.  The patient does not have concerns regarding medicines other than what has been noted above.  The following changes have been made:  See above.  Labs/ tests ordered today include:    Orders Placed This Encounter  Procedures  . Basic metabolic panel  . CBC  . Hepatic function panel  . Lipid panel     Disposition:   FU with me in 6 months.   Patient is agreeable to this plan and will call if any problems develop in the interim.   SignedNorma Fredrickson, NP  12/05/2018 8:23 AM  [facility] [address] [place], [place]  [address] Phone: [phone] Fax: [phone]

## 2018-12-05 NOTE — Patient Instructions (Addendum)
We will be checking the following labs today - BMET, CBC, HPF and Lipids   Medication Instructions:    Continue with your current medicines.   I have refilled the Norvasc today   If you need a refill on your cardiac medications before your next appointment, please call your pharmacy.     Testing/Procedures To Be Arranged:  Carotid dopplers after April 2020  Follow-Up:   See me in 6 months    At [facility], you and your health needs are our priority.  As part of our continuing mission to provide you with exceptional heart care, we have created designated Provider Care Teams.  These Care Teams include your primary Cardiologist (physician) and Advanced Practice Providers (APPs -  Physician Assistants and Nurse Practitioners) who all work together to provide you with the care you need, when you need it.  Special Instructions:  Marland Kitchen Monitor your BP for [place] - call if consistently staying above 140/90  Call the [facility] office at [phone] if you have any questions, problems or concerns.

## 2018-12-06 LAB — CBC
Hematocrit: 37.4 % (ref 34.0–46.6)
Hemoglobin: 12.6 g/dL (ref 11.1–15.9)
MCH: 32.6 pg (ref 26.6–33.0)
MCHC: 33.7 g/dL (ref 31.5–35.7)
MCV: 97 fL (ref 79–97)
Platelets: 268 10*3/uL (ref 150–450)
RBC: 3.86 x10E6/uL (ref 3.77–5.28)
RDW: 13.4 % (ref 11.7–15.4)
WBC: 10.6 10*3/uL (ref 3.4–10.8)

## 2018-12-06 LAB — HEPATIC FUNCTION PANEL
ALT: 28 IU/L (ref 0–32)
AST: 15 IU/L (ref 0–40)
Albumin: 4 g/dL (ref 3.8–4.9)
Alkaline Phosphatase: 95 IU/L (ref 39–117)
Bilirubin Total: 0.4 mg/dL (ref 0.0–1.2)
Bilirubin, Direct: 0.1 mg/dL (ref 0.00–0.40)
Total Protein: 6.7 g/dL (ref 6.0–8.5)

## 2018-12-06 LAB — BASIC METABOLIC PANEL
BUN/Creatinine Ratio: 19 (ref 9–23)
BUN: 14 mg/dL (ref 6–24)
CO2: 20 mmol/L (ref 20–29)
Calcium: 9.7 mg/dL (ref 8.7–10.2)
Chloride: 104 mmol/L (ref 96–106)
Creatinine, Ser: 0.75 mg/dL (ref 0.57–1.00)
GFR calc Af Amer: 102 mL/min/{1.73_m2} (ref >59)
GFR calc non Af Amer: 88 mL/min/{1.73_m2} (ref >59)
Glucose: 95 mg/dL (ref 65–99)
Potassium: 4.6 mmol/L (ref 3.5–5.2)
Sodium: 139 mmol/L (ref 134–144)

## 2018-12-06 LAB — LIPID PANEL
Chol/HDL Ratio: 2.7 ratio (ref 0.0–4.4)
Cholesterol, Total: 146 mg/dL (ref 100–199)
HDL: 54 mg/dL (ref >39)
LDL Calculated: 75 mg/dL (ref 0–99)
Triglycerides: 83 mg/dL (ref 0–149)
VLDL Cholesterol Cal: 17 mg/dL (ref 5–40)

## 2019-01-12 ENCOUNTER — Other Ambulatory Visit: Payer: Self-pay | Admitting: *Deleted

## 2019-01-12 MED ORDER — METOPROLOL TARTRATE 25 MG PO TABS: 12.5000 mg | ORAL_TABLET | Freq: Two times a day (BID) | ORAL | 3 refills | Status: DC

## 2019-01-12 NOTE — Telephone Encounter (Signed)
Patient left a msg on the refill vm requesting that the quantity on the metoprolol rx be corrected. She states that [facility] only sends her a quantity of #45 and she takes 1/2 tablet bid which should be a quantity of #90. I have updated quantity and resent rx.

## 2019-04-03 ENCOUNTER — Ambulatory Visit (HOSPITAL_COMMUNITY): Admission: RE | Admit: 2019-04-03 | Payer: 59 | Source: Ambulatory Visit

## 2019-05-03 ENCOUNTER — Ambulatory Visit (HOSPITAL_COMMUNITY)
Admission: RE | Admit: 2019-05-03 | Discharge: 2019-05-03 | Disposition: A | Discharge status: Home / Self Care | Payer: 59 | Source: Ambulatory Visit | Attending: Cardiology | Admitting: Cardiology

## 2019-05-03 ENCOUNTER — Other Ambulatory Visit: Payer: Self-pay

## 2019-05-03 DIAGNOSIS — I6523 Occlusion and stenosis of bilateral carotid arteries: Secondary | ICD-10-CM | POA: Insufficient documentation

## 2019-05-27 NOTE — Progress Notes (Signed)
CARDIOLOGY OFFICE NOTE  Date:  05/29/2019    [name] Date of Birth: [birth year] Medical Record #[id number]  PCP:  Jonathon Resides, MD  Cardiologist:  Jerel Shepherd    Chief Complaint  Patient presents with  . Coronary Artery Disease    Follow up visit - seen for Dr. Burt Knack    History of Present Illness: Sara Trujillo is a 58 y.o. female who presents today for a 6 month check.  Seen for Dr. Burt Knack.She has primarily followed with me.  She has history of tobacco abuse, CAD with inferior STEMI, HTN, hyperlipidemia, GERD, anxiety/depression and bipolar disorder.She has had angioedema with ACE inhibitors.  She was admitted 3/19-3/23 of 2016 with an inferior STEMI treated with DES x 2 to distal and prox RCA.She was noted to have obstructive disease of the mCx and planned to have staged PCI. This was attempted 01/06/15 but there was difficulty crossing the lesion and resulted in a dissection and attempt at re-crossing into the true lumen was unsuccessful. Of note, she also has moderate non-obstructive disease of LAD.A 2D Echo 01/06/15: EF 50-55%, probable mild HK of inferior myocardium, grade 1 DD, mild MR. She was discharged with standard MI therapy and did well until 3/25, when she had severe emotional upset related to her dog's illness (the dog passed away).In that setting, she developed severe c/p and presented to [facility] where ECG was non-acute but troponin was elevated. Her Troponin with her MI was 59, down to 9 at discharge. It was 3.25 on 01/10/15 and falling to 2 by discharge the next day.She was then seen if f/u 01/17/15 by Melina Copa and was doing OK. Lots of stress at home- "my husband is dying of cancer". She again presented to the ED 01/25/15 with chest discomfort "my chest just doesn't feel right". Her Troponin was negative then and she was reassured.   I have followed her since - her husband passed. Shecontinues to smoke.She had lost weight  then gained back. BP high and I increased her ACE. Depression has been an ongoing issue. Diagnosed with RA and was on chronicprednisone and onPlaquenil.Last seen by me back in February of 2020 and felt to be stable from our standpoint - BP little high - probably getting too much salt.   The patient does not have symptoms concerning for COVID-19 infection (fever, chills, cough, or new shortness of breath).   Comes in today. Here alone. Doing ok. She is working remotely from home due to the pandemic. She feels good. Still smoking but not more. No chest pain. Actively losing weight (since "I have to eat my own cooking). Tolerating her medicines. Feels like she is doing well and has no concerns. We reviewed her carotid study done last month.   Past Medical History:  Diagnosis Date  . Anxiety   . Arthritis   . Bipolar disorder ([place])   . CAD (coronary artery disease)    a. 12/2014 Inferior Inf STEMI/PCI: LM nl, LAD 60p, D1 50-60p, LCX nondom 98m, RI 80,m RCA dom, 100p (DES to prox and distal RCA), EF 60%;  b 12/2014 Staged attempted PCI of AV groove LCX with resultant dissection->aborted;  c. 12/2014 Echo: EF 50-55%, mod LVH, mild inf HK, Gr 1 DD, mild MR.  . Daily headache   . Depression   . GERD (gastroesophageal reflux disease)   . Hyperlipidemia   . Hypertension   . Tobacco abuse     Past Surgical History:  Procedure Laterality  Date  . LAPAROSCOPIC CHOLECYSTECTOMY  1990's  . LEFT HEART CATHETERIZATION WITH CORONARY ANGIOGRAM N/A 01/04/2015   Procedure: LEFT HEART CATHETERIZATION WITH CORONARY ANGIOGRAM;  Surgeon: Runell GessJonathan J Berry, MD; LAD 60%, D1 60%, CFX 90%, RI 80%, RCA 100%>>0% w/  2.25 x 12 mm Xience DES, EF 60%    . PERCUTANEOUS CORONARY STENT INTERVENTION (PCI-S)  01/04/2015   Procedure: PERCUTANEOUS CORONARY STENT INTERVENTION (PCI-S);  Surgeon: Runell GessJonathan J Berry, MD;  2.25 x 12 mm Xience DES to RCA    . PERCUTANEOUS CORONARY STENT INTERVENTION (PCI-S) N/A 01/06/2015   Procedure:  PERCUTANEOUS CORONARY STENT INTERVENTION (PCI-S);  Surgeon: Runell GessJonathan J Berry, MD; RCA OK, CFX unsuccessfult PCI 2nd distal dissection      Medications: Current Meds  Medication Sig  . amLODipine (NORVASC) 5 MG tablet Take 1 tablet (5 mg total) by mouth daily.  Marland Kitchen. aspirin 81 MG tablet Take 1 tablet (81 mg total) by mouth daily.  Marland Kitchen. atorvastatin (LIPITOR) 80 MG tablet TAKE 1 TABLET DAILY  . BRILINTA 90 MG TABS tablet TAKE 1 TABLET TWICE A DAY  . dextrose 5 % SOLN 50 mL with methotrexate 25 MG/ML (PF) SOLN 40 mg/m2 Inject into the vein once a week. 1 ml weekly  . folic acid (FOLVITE) 1 MG tablet Take 1 mg by mouth 2 (two) times daily.   . hydroxychloroquine (PLAQUENIL) 200 MG tablet take one tablet twice a day with food or milk  . Melatonin 10 MG CAPS Take 10 mg by mouth at bedtime.  . metoprolol tartrate (LOPRESSOR) 25 MG tablet Take 0.5 tablets (12.5 mg total) by mouth 2 (two) times daily.  . nitroGLYCERIN (NITROSTAT) 0.4 MG SL tablet Place 1 tablet (0.4 mg total) under the tongue every 5 (five) minutes as needed for chest pain.  . predniSONE (DELTASONE) 5 MG tablet Take 2.5 mg by mouth 2 (two) times daily with a meal.  . traMADol (ULTRAM) 50 MG tablet take one tablet 4 times as needed for wrist pain/RA     Allergies: Allergies  Allergen Reactions  . Lisinopril Swelling  . Penicillins   . Sulfa Antibiotics     Parents both allergic and was told not to take sulfa drugs    Social History: The patient  reports that she has been smoking cigarettes. She has a 21.75 pack-year smoking history. She has never used smokeless tobacco. She reports current alcohol use. She reports that she does not use drugs.   Family History: The patient's family history includes Alzheimer's disease in her mother; Arrhythmia in her mother; COPD in her mother; Cancer - Colon in her father; Hyperlipidemia in her sister; Osteoarthritis in her mother.   Review of Systems: Please see the history of present illness.    All other systems are reviewed and negative.   Physical Exam: VS:  BP 138/80 (BP Location: Left Arm, Patient Position: Sitting, Cuff Size: Normal)   Pulse 68   Ht 5\' 11"  (1.803 m)   Wt 164 lb 6.4 oz (74.6 kg)   BMI 22.93 kg/m  .  BMI Body mass index is 22.93 kg/m.  Wt Readings from Last 3 Encounters:  05/29/19 164 lb 6.4 oz (74.6 kg)  12/05/18 180 lb 1.9 oz (81.7 kg)  05/23/18 182 lb (82.6 kg)    General: Pleasant. Well developed, well nourished and in no acute distress. Her weight is down.  HEENT: Normal.  Neck: Supple, no JVD, carotid bruits, or masses noted.  Cardiac: Regular rate and rhythm. No murmurs, rubs, or  gallops. No edema.  Respiratory:  Lungs are clear to auscultation bilaterally with normal work of breathing.  GI: Soft and nontender.  MS: No deformity or atrophy. Gait and ROM intact.  Skin: Warm and dry. Color is normal.  Neuro:  Strength and sensation are intact and no gross focal deficits noted.  Psych: Alert, appropriate and with normal affect.   LABORATORY DATA:  EKG:  EKG is ordered today. This demonstrates NSR with inferior Q's -unchanged. HR is 68.   Lab Results  Component Value Date   WBC 10.6 12/05/2018   HGB 12.6 12/05/2018   HCT 37.4 12/05/2018   PLT 268 12/05/2018   GLUCOSE 95 12/05/2018   CHOL 146 12/05/2018   TRIG 83 12/05/2018   HDL 54 12/05/2018   LDLCALC 75 12/05/2018   ALT 28 12/05/2018   AST 15 12/05/2018   NA 139 12/05/2018   K 4.6 12/05/2018   CL 104 12/05/2018   CREATININE 0.75 12/05/2018   BUN 14 12/05/2018   CO2 20 12/05/2018   INR 1.04 01/05/2015   HGBA1C 5.6 01/05/2015     BNP (last 3 results) No results for input(s): BNP in the last 8760 hours.  ProBNP (last 3 results) No results for input(s): PROBNP in the last 8760 hours.   Other Studies Reviewed Today:   Summary: Right Carotid: The ECA appears >50% stenosed. The extracranial vessels were                near-normal with only minimal wall thickening or  plaque. No                plaque evident throughout the ICA, minimum wall thickening is                noted in the proximal segment.  Left Carotid: The ECA appears >50% stenosed. The extracranial vessels were               near-normal with only minimal wall thickening or plaque. No plaque               evident throughout the ICA.  Vertebrals:  Bilateral vertebral arteries demonstrate antegrade flow. Small              caliber left vertebral artery. Subclavians: Normal flow hemodynamics were seen in bilateral subclavian              arteries.  *See table(s) above for measurements and observations.     Electronically signed by Lance MussJayadeep Varanasi MD on 05/03/2019 at 4:29:34 PM.   Echo Study Conclusions 2016  - Left ventricle: Wall thickness was increased in a pattern of  moderate LVH. Systolic function was normal. The estimated  ejection fraction was in the range of 50% to 55%. Probable mild  hypokinesis of the inferior myocardium. Doppler parameters are  consistent with abnormal left ventricular relaxation (grade 1  diastolic dysfunction).  - Mitral valve: There was mild regurgitation.   Assessment/Plan:  1. CAD with prior inferior STEMI treated with DES x 2 to distal and prox RCA back in March of 2016.She was noted to have obstructive disease of the mCx and planned to have staged PCI. This was attempted 01/06/15 but there was difficulty crossing the lesion and resulted in a dissection and attempt at re-crossing into the true lumen was unsuccessful. She has moderate non-obstructive disease of LAD also.   She has been followed clinically since her initial event and she continues to do well. She has no active symptoms.  She has intentionally lost weight. She is still smoking - not ready to stop. Her RA is her most bothersome issue at this time. Lab today.   Will ask Dr. Excell Seltzerooper to weigh in on possibly changing to maintenance dose of Brilinta. We will let her know.   2.  HTN -BP looks good - no changes made.   3. HLD -remains on statin - lab today.   4. Tobacco abuse -not smoking more since she is at home. Not ready to stop.    5.Carotid disease - most recent study shows no ICA disease.   6. RA - seems to be her most limiting issue.   7. COVID-19 Education: The signs and symptoms of COVID-19 were discussed with the patient and how to seek care for testing (follow up with PCP or arrange E-visit).  The importance of social distancing, staying at home, hand hygiene and wearing a mask when out in public were discussed today.  Current medicines are reviewed with the patient today.  The patient does not have concerns regarding medicines other than what has been noted above.  The following changes have been made:  See above.  Labs/ tests ordered today include:    Orders Placed This Encounter  Procedures  . Basic metabolic panel  . CBC  . Hepatic function panel  . Lipid panel  . EKG 12-Lead     Disposition:   FU with me in 6 months.   Patient is agreeable to this plan and will call if any problems develop in the interim.   SignedNorma Fredrickson: Affan Callow, NP  05/29/2019 8:34 AM  [facility] [address] [place], [place]  [address] Phone: [phone] Fax: [phone]

## 2019-05-29 ENCOUNTER — Ambulatory Visit (INDEPENDENT_AMBULATORY_CARE_PROVIDER_SITE_OTHER): Payer: 59 | Admitting: Nurse Practitioner

## 2019-05-29 ENCOUNTER — Other Ambulatory Visit: Payer: Self-pay

## 2019-05-29 ENCOUNTER — Encounter: Payer: Self-pay | Admitting: Nurse Practitioner

## 2019-05-29 VITALS — BP 138/80 | HR 68 | Ht 71.0 in | Wt 164.4 lb

## 2019-05-29 DIAGNOSIS — I259 Chronic ischemic heart disease, unspecified: Secondary | ICD-10-CM

## 2019-05-29 DIAGNOSIS — I10 Essential (primary) hypertension: Secondary | ICD-10-CM | POA: Diagnosis not present

## 2019-05-29 DIAGNOSIS — Z720 Tobacco use: Secondary | ICD-10-CM | POA: Diagnosis not present

## 2019-05-29 DIAGNOSIS — E7800 Pure hypercholesterolemia, unspecified: Secondary | ICD-10-CM | POA: Diagnosis not present

## 2019-05-29 LAB — HEPATIC FUNCTION PANEL
ALT: 18 IU/L (ref 0–32)
AST: 16 IU/L (ref 0–40)
Albumin: 4.4 g/dL (ref 3.8–4.9)
Alkaline Phosphatase: 105 IU/L (ref 39–117)
Bilirubin Total: 0.4 mg/dL (ref 0.0–1.2)
Bilirubin, Direct: 0.12 mg/dL (ref 0.00–0.40)
Total Protein: 6.8 g/dL (ref 6.0–8.5)

## 2019-05-29 LAB — BASIC METABOLIC PANEL
BUN/Creatinine Ratio: 13 (ref 9–23)
BUN: 10 mg/dL (ref 6–24)
CO2: 19 mmol/L — ABNORMAL LOW (ref 20–29)
Calcium: 9.6 mg/dL (ref 8.7–10.2)
Chloride: 103 mmol/L (ref 96–106)
Creatinine, Ser: 0.78 mg/dL (ref 0.57–1.00)
GFR calc Af Amer: 97 mL/min/{1.73_m2} (ref >59)
GFR calc non Af Amer: 84 mL/min/{1.73_m2} (ref >59)
Glucose: 93 mg/dL (ref 65–99)
Potassium: 4.2 mmol/L (ref 3.5–5.2)
Sodium: 137 mmol/L (ref 134–144)

## 2019-05-29 LAB — CBC
Hematocrit: 39.3 % (ref 34.0–46.6)
Hemoglobin: 13.5 g/dL (ref 11.1–15.9)
MCH: 32.2 pg (ref 26.6–33.0)
MCHC: 34.4 g/dL (ref 31.5–35.7)
MCV: 94 fL (ref 79–97)
Platelets: 276 10*3/uL (ref 150–450)
RBC: 4.19 x10E6/uL (ref 3.77–5.28)
RDW: 13.2 % (ref 11.7–15.4)
WBC: 9.6 10*3/uL (ref 3.4–10.8)

## 2019-05-29 LAB — LIPID PANEL
Chol/HDL Ratio: 2.8 ratio (ref 0.0–4.4)
Cholesterol, Total: 134 mg/dL (ref 100–199)
HDL: 48 mg/dL (ref >39)
LDL Calculated: 66 mg/dL (ref 0–99)
Triglycerides: 100 mg/dL (ref 0–149)
VLDL Cholesterol Cal: 20 mg/dL (ref 5–40)

## 2019-05-29 NOTE — Patient Instructions (Addendum)
After Visit Summary:  We will be checking the following labs today - BMET, CBC, HPF and Lipids   Medication Instructions:    Continue with your current medicines.  I will talk with Dr. Burt Knack about reducing your dose of Brilinta    If you need a refill on your cardiac medications before your next appointment, please call your pharmacy.     Testing/Procedures To Be Arranged:  N/A  Follow-Up:   See me in 6 months    At [facility], you and your health needs are our priority.  As part of our continuing mission to provide you with exceptional heart care, we have created designated Provider Care Teams.  These Care Teams include your primary Cardiologist (physician) and Advanced Practice Providers (APPs -  Physician Assistants and Nurse Practitioners) who all work together to provide you with the care you need, when you need it.  Special Instructions:  . Stay safe, stay home, wash your hands for at least 20 seconds and wear a mask when out in public.  . It was good to talk with you today.  Kermit Balo work with the weight loss!!   Call the [place] office at [phone] if you have any questions, problems or concerns.

## 2019-05-29 NOTE — Progress Notes (Signed)
Great idea - would reduce brilinta to 60 mg BID. thanks

## 2019-05-29 NOTE — Progress Notes (Signed)
Ok.   Sara Trujillo will you call and let her know we can reduce the Brilinta to 60 mg BID.   [name]

## 2019-05-30 ENCOUNTER — Other Ambulatory Visit: Payer: Self-pay | Admitting: *Deleted

## 2019-05-30 MED ORDER — TICAGRELOR 60 MG PO TABS: ORAL_TABLET | ORAL | 1 refills | Status: DC

## 2019-09-06 ENCOUNTER — Other Ambulatory Visit: Payer: Self-pay | Admitting: Nurse Practitioner

## 2019-09-10 ENCOUNTER — Encounter: Payer: Self-pay | Admitting: *Deleted

## 2019-11-01 ENCOUNTER — Other Ambulatory Visit: Payer: Self-pay | Admitting: Nurse Practitioner

## 2019-11-08 ENCOUNTER — Other Ambulatory Visit: Payer: Self-pay | Admitting: Nurse Practitioner

## 2019-11-12 ENCOUNTER — Other Ambulatory Visit: Payer: Self-pay | Admitting: Nurse Practitioner

## 2019-11-27 ENCOUNTER — Ambulatory Visit: Payer: 59 | Admitting: Nurse Practitioner

## 2019-12-06 NOTE — Progress Notes (Deleted)
CARDIOLOGY OFFICE NOTE  Date:  12/06/2019    [name] Date of Birth: 1961-09-01 Medical Record #[id number]  PCP:  Gillian Scarce, MD  Cardiologist:  Kirt Boys   No chief complaint on file.   History of Present Illness: Sara Trujillo is a 59 y.o. female who presents today for a follow up visit.  Seen for Dr. Excell Seltzer.She has primarily followed with me.  She has history of tobacco abuse, CAD with inferior STEMI, HTN, hyperlipidemia, GERD, anxiety/depression and bipolar disorder.She has had angioedema with ACE inhibitors.  She was admitted 3/19-3/23 of 2016 with an inferior STEMI treated with DES x 2 to distal and prox RCA.She was noted to have obstructive disease of the mCx and planned to have staged PCI. This was attempted 01/06/15 but there was difficulty crossing the lesion and resulted in a dissection and attempt at re-crossing into the true lumen was unsuccessful. Of note, she also has moderate non-obstructive disease of LAD.A 2D Echo 01/06/15: EF 50-55%, probable mild HK of inferior myocardium, grade 1 DD, mild MR. She was discharged with standard MI therapy and did well until 3/25, when she had severe emotional upset related to her dog's illness (the dog passed away).In that setting, she developed severe c/p and presented to [facility] where ECG was non-acute but troponin was elevated. Her Troponin with her MI was 59, down to 9 at discharge. It was 3.25 on 01/10/15 and falling to 2 by discharge the next day.She was then seen if f/u 01/17/15 by Ronie Spies and was doing OK. Lots of stress at home- "my husband is dying of cancer". She again presented to the ED 01/25/15 with chest discomfort "my chest just doesn't feel right". Her Troponin was negative then and she was reassured.   I have followed her since - her husband passed. Shecontinues to smoke.She had lost weight then gained back. BP high and I increased her ACE. Depression has been an ongoing  issue. Diagnosed with RA and was on chronicprednisone and onPlaquenil.Last seen by me back inAugust - working remotely due to the pandemic. Continues to smoke - had been successful with weight loss. Carotids have been updated.   The patient {does/does not:200015} have symptoms concerning for COVID-19 infection (fever, chills, cough, or new shortness of breath).   Comes in today. Here with   Past Medical History:  Diagnosis Date  . Anxiety   . Arthritis   . Bipolar disorder ([facility])   . CAD (coronary artery disease)    a. 12/2014 Inferior Inf STEMI/PCI: LM nl, LAD 60p, D1 50-60p, LCX nondom 23m, RI 80,m RCA dom, 100p (DES to prox and distal RCA), EF 60%;  b 12/2014 Staged attempted PCI of AV groove LCX with resultant dissection->aborted;  c. 12/2014 Echo: EF 50-55%, mod LVH, mild inf HK, Gr 1 DD, mild MR.  . Daily headache   . Depression   . GERD (gastroesophageal reflux disease)   . Hyperlipidemia   . Hypertension   . Tobacco abuse     Past Surgical History:  Procedure Laterality Date  . LAPAROSCOPIC CHOLECYSTECTOMY  1990's  . LEFT HEART CATHETERIZATION WITH CORONARY ANGIOGRAM N/A 01/04/2015   Procedure: LEFT HEART CATHETERIZATION WITH CORONARY ANGIOGRAM;  Surgeon: Runell Gess, MD; LAD 60%, D1 60%, CFX 90%, RI 80%, RCA 100%>>0% w/  2.25 x 12 mm Xience DES, EF 60%    . PERCUTANEOUS CORONARY STENT INTERVENTION (PCI-S)  01/04/2015   Procedure: PERCUTANEOUS CORONARY STENT INTERVENTION (PCI-S);  Surgeon: Christiane Ha  Erlene Quan, MD;  2.25 x 12 mm Xience DES to RCA    . PERCUTANEOUS CORONARY STENT INTERVENTION (PCI-S) N/A 01/06/2015   Procedure: PERCUTANEOUS CORONARY STENT INTERVENTION (PCI-S);  Surgeon: Runell Gess, MD; RCA OK, CFX unsuccessfult PCI 2nd distal dissection      Medications: No outpatient medications have been marked as taking for the 12/10/19 encounter (Appointment) with Rosalio Macadamia, NP.     Allergies: Allergies  Allergen Reactions  . Lisinopril Swelling  .  Penicillins   . Sulfa Antibiotics     Parents both allergic and was told not to take sulfa drugs    Social History: The patient  reports that she has been smoking cigarettes. She has a 21.75 pack-year smoking history. She has never used smokeless tobacco. She reports current alcohol use. She reports that she does not use drugs.   Family History: The patient's ***family history includes Alzheimer's disease in her mother; Arrhythmia in her mother; COPD in her mother; Cancer - Colon in her father; Hyperlipidemia in her sister; Osteoarthritis in her mother.   Review of Systems: Please see the history of present illness.   All other systems are reviewed and negative.   Physical Exam: VS:  There were no vitals taken for this visit. Marland Kitchen  BMI There is no height or weight on file to calculate BMI.  Wt Readings from Last 3 Encounters:  05/29/19 164 lb 6.4 oz (74.6 kg)  12/05/18 180 lb 1.9 oz (81.7 kg)  05/23/18 182 lb (82.6 kg)    General: Pleasant. Well developed, well nourished and in no acute distress.   HEENT: Normal.  Neck: Supple, no JVD, carotid bruits, or masses noted.  Cardiac: ***Regular rate and rhythm. No murmurs, rubs, or gallops. No edema.  Respiratory:  Lungs are clear to auscultation bilaterally with normal work of breathing.  GI: Soft and nontender.  MS: No deformity or atrophy. Gait and ROM intact.  Skin: Warm and dry. Color is normal.  Neuro:  Strength and sensation are intact and no gross focal deficits noted.  Psych: Alert, appropriate and with normal affect.   LABORATORY DATA:  EKG:  EKG {ACTION; IS/IS KDT:[id number]} ordered today. This demonstrates ***.  Lab Results  Component Value Date   WBC 9.6 05/29/2019   HGB 13.5 05/29/2019   HCT 39.3 05/29/2019   PLT 276 05/29/2019   GLUCOSE 93 05/29/2019   CHOL 134 05/29/2019   TRIG 100 05/29/2019   HDL 48 05/29/2019   LDLCALC 66 05/29/2019   ALT 18 05/29/2019   AST 16 05/29/2019   NA 137 05/29/2019   K 4.2  05/29/2019   CL 103 05/29/2019   CREATININE 0.78 05/29/2019   BUN 10 05/29/2019   CO2 19 (L) 05/29/2019   INR 1.04 01/05/2015   HGBA1C 5.6 01/05/2015     BNP (last 3 results) No results for input(s): BNP in the last 8760 hours.  ProBNP (last 3 results) No results for input(s): PROBNP in the last 8760 hours.   Other Studies Reviewed Today:  Carotid Doppler Summary 04/2019: Right Carotid: The ECA appears >50% stenosed. The extracranial vessels were near-normal with only minimal wall thickening or plaque. No plaque evident throughout the ICA, minimum wall thickening is noted in the proximal segment.  Left Carotid: The ECA appears >50% stenosed. The extracranial vessels were near-normal with only minimal wall thickening or plaque. No plaque evident throughout the ICA.  Vertebrals: Bilateral vertebral arteries demonstrate antegrade flow. Small caliber left vertebral artery. Subclavians: Normal  flow hemodynamics were seen in bilateral subclavian arteries.  *See table(s) above for measurements and observations.  Electronically signed by Larae Grooms MD on 05/03/2019 at 4:29:34 PM.  Echo Study Conclusions 2016  - Left ventricle: Wall thickness was increased in a pattern of  moderate LVH. Systolic function was normal. The estimated  ejection fraction was in the range of 50% to 55%. Probable mild  hypokinesis of the inferior myocardium. Doppler parameters are  consistent with abnormal left ventricular relaxation (grade 1  diastolic dysfunction).  - Mitral valve: There was mild regurgitation.   Assessment/Plan:  1. CAD with prior inferior STEMI treated with DES x 2 to distal and prox RCA back in March of 2016.She was noted to have obstructive disease of the mCx and planned to have staged PCI. This was attempted 01/06/15 but there was difficulty crossing the lesion and resulted in a dissection and attempt at re-crossing into the true lumen was unsuccessful. She  has moderate non-obstructive disease of LAD also.   Nunzio Cory been followed clinically since her initial event and she continues to do well. She has no active symptoms. She has intentionally lost weight. She is still smoking - not ready to stop. Her RA is her most bothersome issue at this time. Lab today.  Will ask Dr. Burt Knack to weigh in on possibly changing to maintenance dose of Brilinta. We will let her know.   2. HTN -BP looks good - no changes made.   3. HLD -remains on statin - lab today.   4. Tobacco abuse -not smoking more since she is at home. Not ready to stop.   5.Carotid disease - most recent study shows no ICA disease.   6. RA - seems to be her most limiting issue.  Marland Kitchen COVID-19 Education: The signs and symptoms of COVID-19 were discussed with the patient and how to seek care for testing (follow up with PCP or arrange E-visit).  The importance of social distancing, staying at home, hand hygiene and wearing a mask when out in public were discussed today.  Current medicines are reviewed with the patient today.  The patient does not have concerns regarding medicines other than what has been noted above.  The following changes have been made:  See above.  Labs/ tests ordered today include:   No orders of the defined types were placed in this encounter.    Disposition:   FU with *** in {gen number 5-39:767341} {Days to years:10300}.   Patient is agreeable to this plan and will call if any problems develop in the interim.   SignedTruitt Merle, NP  12/06/2019 8:43 AM  [facility] [address] [place], [place]  [address] Phone: [phone] Fax: [phone]

## 2019-12-10 ENCOUNTER — Telehealth: Payer: Self-pay | Admitting: *Deleted

## 2019-12-10 ENCOUNTER — Ambulatory Visit: Payer: 59 | Admitting: Nurse Practitioner

## 2019-12-10 NOTE — Telephone Encounter (Signed)
S/w pt is aware appt was missed today and R/S for March.

## 2019-12-11 ENCOUNTER — Ambulatory Visit: Payer: 59 | Admitting: Nurse Practitioner

## 2019-12-21 NOTE — Progress Notes (Deleted)
CARDIOLOGY OFFICE NOTE  Date:  12/25/2019    Sara Trujillo Date of Birth: 1961-09-11 Medical Record #[id number]  PCP:  Jonathon Resides, MD  Cardiologist:  Jerel Shepherd    No chief complaint on file.   History of Present Illness: Sara Trujillo is a 59 y.o. female who presents today for a follow up visit.  Seen for Dr. Burt Knack.She has primarily followed with me.  She has history of tobacco abuse, CAD with inferior STEMI, HTN, hyperlipidemia, GERD, anxiety/depression and bipolar disorder.She has had angioedema with ACE inhibitors.  She was admitted 3/19-3/23 of 2016 with an inferior STEMI treated with DES x 2 to distal and prox RCA.She was noted to have obstructive disease of the mCx and planned to have staged PCI. This was attempted 01/06/15 but there was difficulty crossing the lesion and resulted in a dissection and attempt at re-crossing into the true lumen was unsuccessful. Of note, she also has moderate non-obstructive disease of LAD.A 2D Echo 01/06/15: EF 50-55%, probable mild HK of inferior myocardium, grade 1 DD, mild MR. She was discharged with standard MI therapy and did well until 3/25, when she had severe emotional upset related to her dog's illness (the dog passed away).In that setting, she developed severe c/p and presented to [facility] where ECG was non-acute but troponin was elevated. Her Troponin with her MI was 59, down to 9 at discharge. It was 3.25 on 01/10/15 and falling to 2 by discharge the next day.She was then seen if f/u 01/17/15 by Melina Copa and was doing OK. Lots of stress at home- "my husband is dying of cancer". She again presented to the ED 01/25/15 with chest discomfort "my chest just doesn't feel right". Her Troponin was negative then and she was reassured.   I have followed her since - her husband passed. Shecontinues to smoke.She had lost weight then gained back. BP high and I increased her ACE. Depression has been an ongoing  issue. Diagnosed with RA and was on chronicprednisone and onPlaquenil.Tends to get too much salt. Last seen in August - was doing well. Losing weight since "eating my own cooking" with the pandemic. Still smoking.   The patient {does/does not:200015} have symptoms concerning for COVID-19 infection (fever, chills, cough, or new shortness of breath).   Comes in today. Here with   Past Medical History:  Diagnosis Date  . Anxiety   . Arthritis   . Bipolar disorder ([place])   . CAD (coronary artery disease)    a. 12/2014 Inferior Inf STEMI/PCI: LM nl, LAD 60p, D1 50-60p, LCX nondom 54m, RI 80,m RCA dom, 100p (DES to prox and distal RCA), EF 60%;  b 12/2014 Staged attempted PCI of AV groove LCX with resultant dissection->aborted;  c. 12/2014 Echo: EF 50-55%, mod LVH, mild inf HK, Gr 1 DD, mild MR.  . Daily headache   . Depression   . GERD (gastroesophageal reflux disease)   . Hyperlipidemia   . Hypertension   . Tobacco abuse     Past Surgical History:  Procedure Laterality Date  . LAPAROSCOPIC CHOLECYSTECTOMY  1990's  . LEFT HEART CATHETERIZATION WITH CORONARY ANGIOGRAM N/A 01/04/2015   Procedure: LEFT HEART CATHETERIZATION WITH CORONARY ANGIOGRAM;  Surgeon: Lorretta Harp, MD; LAD 60%, D1 60%, CFX 90%, RI 80%, RCA 100%>>0% w/  2.25 x 12 mm Xience DES, EF 60%    . PERCUTANEOUS CORONARY STENT INTERVENTION (PCI-S)  01/04/2015   Procedure: PERCUTANEOUS CORONARY STENT INTERVENTION (PCI-S);  Surgeon: Roderic Palau  Erlene Quan, MD;  2.25 x 12 mm Xience DES to RCA    . PERCUTANEOUS CORONARY STENT INTERVENTION (PCI-S) N/A 01/06/2015   Procedure: PERCUTANEOUS CORONARY STENT INTERVENTION (PCI-S);  Surgeon: Runell Gess, MD; RCA OK, CFX unsuccessfult PCI 2nd distal dissection      Medications: No outpatient medications have been marked as taking for the 12/28/19 encounter (Appointment) with Rosalio Macadamia, NP.     Allergies: Allergies  Allergen Reactions  . Lisinopril Swelling  . Penicillins   .  Sulfa Antibiotics     Parents both allergic and was told not to take sulfa drugs    Social History: The patient  reports that she has been smoking cigarettes. She has a 21.75 pack-year smoking history. She has never used smokeless tobacco. She reports current alcohol use. She reports that she does not use drugs.   Family History: The patient's ***family history includes Alzheimer's disease in her mother; Arrhythmia in her mother; COPD in her mother; Cancer - Colon in her father; Hyperlipidemia in her sister; Osteoarthritis in her mother.   Review of Systems: Please see the history of present illness.   All other systems are reviewed and negative.   Physical Exam: VS:  There were no vitals taken for this visit. Marland Kitchen  BMI There is no height or weight on file to calculate BMI.  Wt Readings from Last 3 Encounters:  05/29/19 164 lb 6.4 oz (74.6 kg)  12/05/18 180 lb 1.9 oz (81.7 kg)  05/23/18 182 lb (82.6 kg)    General: Pleasant. Well developed, well nourished and in no acute distress.   HEENT: Normal.  Neck: Supple, no JVD, carotid bruits, or masses noted.  Cardiac: ***Regular rate and rhythm. No murmurs, rubs, or gallops. No edema.  Respiratory:  Lungs are clear to auscultation bilaterally with normal work of breathing.  GI: Soft and nontender.  MS: No deformity or atrophy. Gait and ROM intact.  Skin: Warm and dry. Color is normal.  Neuro:  Strength and sensation are intact and no gross focal deficits noted.  Psych: Alert, appropriate and with normal affect.   LABORATORY DATA:  EKG:  EKG {ACTION; IS/IS TYO:06004599} ordered today. This demonstrates ***.  Lab Results  Component Value Date   WBC 9.6 05/29/2019   HGB 13.5 05/29/2019   HCT 39.3 05/29/2019   PLT 276 05/29/2019   GLUCOSE 93 05/29/2019   CHOL 134 05/29/2019   TRIG 100 05/29/2019   HDL 48 05/29/2019   LDLCALC 66 05/29/2019   ALT 18 05/29/2019   AST 16 05/29/2019   NA 137 05/29/2019   K 4.2 05/29/2019   CL 103  05/29/2019   CREATININE 0.78 05/29/2019   BUN 10 05/29/2019   CO2 19 (L) 05/29/2019   INR 1.04 01/05/2015   HGBA1C 5.6 01/05/2015     BNP (last 3 results) No results for input(s): BNP in the last 8760 hours.  ProBNP (last 3 results) No results for input(s): PROBNP in the last 8760 hours.   Other Studies Reviewed Today:  Carotid Doppler Summary 04/2019: Right Carotid: The ECA appears >50% stenosed. The extracranial vessels were near-normal with only minimal wall thickening or plaque. No plaque evident throughout the ICA, minimum wall thickening is noted in the proximal segment.  Left Carotid: The ECA appears >50% stenosed. The extracranial vessels were near-normal with only minimal wall thickening or plaque. No plaque evident throughout the ICA.  Vertebrals: Bilateral vertebral arteries demonstrate antegrade flow. Small caliber left vertebral artery. Subclavians: Normal  flow hemodynamics were seen in bilateral subclavian arteries.  *See table(s) above for measurements and observations.    Electronically signed by Lance Muss MD on 05/03/2019 at 4:29:34 PM.  Echo Study Conclusions 2016  - Left ventricle: Wall thickness was increased in a pattern of  moderate LVH. Systolic function was normal. The estimated  ejection fraction was in the range of 50% to 55%. Probable mild  hypokinesis of the inferior myocardium. Doppler parameters are  consistent with abnormal left ventricular relaxation (grade 1  diastolic dysfunction).  - Mitral valve: There was mild regurgitation.   Assessment/Plan:  1. CAD with prior inferior STEMI treated with DES x 2 to distal and prox RCA back in March of 2016.She was noted to have obstructive disease of the mCx and planned to have staged PCI. This was attempted 01/06/15 but there was difficulty crossing the lesion and  resulted in a dissection and attempt at re-crossing into the true lumen was unsuccessful. She has moderate non-obstructive disease of LAD also.   Lavetta Nielsen been followed clinically since her initial event and she continues to do well. She has no active symptoms. She has intentionally lost weight. She is still smoking - not ready to stop. Her RA is her most bothersome issue at this time. Lab today.  Will ask Dr. Excell Seltzer to weigh in on possibly changing to maintenance dose of Brilinta. We will let her know.   Great idea - would reduce brilinta to 60 mg BID. thanks  2. HTN -BP looks good - no changes made.   3. HLD -remains on statin - lab today.   4. Tobacco abuse -not smoking more since she is at home. Not ready to stop.   5.Carotid disease - most recent study shows no ICA disease.   6. RA - seems to be her most limiting issue.  Marland Kitchen COVID-19 Education: The signs and symptoms of COVID-19 were discussed with the patient and how to seek care for testing (follow up with PCP or arrange E-visit).  The importance of social distancing, staying at home, hand hygiene and wearing a mask when out in public were discussed today.  Current medicines are reviewed with the patient today.  The patient does not have concerns regarding medicines other than what has been noted above.  The following changes have been made:  See above.  Labs/ tests ordered today include:   No orders of the defined types were placed in this encounter.    Disposition:   FU with *** in {gen number [id number]} {Days to years:10300}.   Patient is agreeable to this plan and will call if any problems develop in the interim.   SignedNorma Fredrickson, NP  12/25/2019 1:56 PM  [facility] [address] [place], [place]  [address] Phone: [phone] Fax: [phone]

## 2019-12-28 ENCOUNTER — Ambulatory Visit: Payer: 59 | Admitting: Nurse Practitioner

## 2020-01-02 NOTE — Progress Notes (Signed)
CARDIOLOGY OFFICE NOTE  Date:  01/09/2020    [name] Date of Birth: 1961/01/22 Medical Record #[id number]  PCP:  Gillian Scarce, MD  Cardiologist:  Kirt Boys  Chief Complaint  Patient presents with  . Follow-up    Seen for Dr. Excell Seltzer    History of Present Illness: Sara Trujillo is a 59 y.o. female who presents today for a follow up visit. Seen for Dr. Excell Seltzer.She has primarily followed with me.  She has history of tobacco abuse, CAD with inferior STEMI, HTN, hyperlipidemia, GERD, anxiety/depression and bipolar disorder.She has had angioedema with ACE inhibitors.  She was admitted 3/19-3/23 of 2016 with an inferior STEMI treated with DES x 2 to distal and prox RCA.She was noted to have obstructive disease of the mCx and planned to have staged PCI. This was attempted 01/06/15 but there was difficulty crossing the lesion and resulted in a dissection and attempt at re-crossing into the true lumen was unsuccessful. Of note, she also has moderate non-obstructive disease of LAD.A 2D Echo 01/06/15: EF 50-55%, probable mild HK of inferior myocardium, grade 1 DD, mild MR. She was discharged with standard MI therapy and did well until 3/25, when she had severe emotional upset related to her dog's illness (the dog passed away).In that setting, she developed severe c/p and presented to [facility] where ECG was non-acute but troponin was elevated. Her Troponin with her MI was 59, down to 9 at discharge. It was 3.25 on 01/10/15 and falling to 2 by discharge the next day.She was then seen if f/u 01/17/15 by Ronie Spies and was doing OK. Lots of stress at home- "my husband is dying of cancer". She again presented to the ED 01/25/15 with chest discomfort "my chest just doesn't feel right". Her Troponin was negative then and she was reassured.   I have followed her since - her husband passed. Shecontinues to smoke.She had lost weight then gained back. BP high and I  increased her ACE. Depression had been an ongoing issue. Diagnosed with RA and on chronicprednisone andPlaquenil.Last seen by me back inAugust of 2020 and was doing well - losing weight - no cardiac issues. Carotids had been updated.   The patient does not have symptoms concerning for COVID-19 infection (fever, chills, cough, or new shortness of breath).   Comes in today. Here alone. She is doing ok. Continues to be successful with losing weight. No chest pain. Breathing is good. Not dizzy or lightheaded. Still smoking - does not quantify. BP is lower. She feels like she is doing well overall.   Past Medical History:  Diagnosis Date  . Anxiety   . Arthritis   . Bipolar disorder ([facility])   . CAD (coronary artery disease)    a. 12/2014 Inferior Inf STEMI/PCI: LM nl, LAD 60p, D1 50-60p, LCX nondom 55m, RI 80,m RCA dom, 100p (DES to prox and distal RCA), EF 60%;  b 12/2014 Staged attempted PCI of AV groove LCX with resultant dissection->aborted;  c. 12/2014 Echo: EF 50-55%, mod LVH, mild inf HK, Gr 1 DD, mild MR.  . Daily headache   . Depression   . GERD (gastroesophageal reflux disease)   . Hyperlipidemia   . Hypertension   . Tobacco abuse     Past Surgical History:  Procedure Laterality Date  . LAPAROSCOPIC CHOLECYSTECTOMY  1990's  . LEFT HEART CATHETERIZATION WITH CORONARY ANGIOGRAM N/A 01/04/2015   Procedure: LEFT HEART CATHETERIZATION WITH CORONARY ANGIOGRAM;  Surgeon: Runell Gess, MD;  LAD 60%, D1 60%, CFX 90%, RI 80%, RCA 100%>>0% w/  2.25 x 12 mm Xience DES, EF 60%    . PERCUTANEOUS CORONARY STENT INTERVENTION (PCI-S)  01/04/2015   Procedure: PERCUTANEOUS CORONARY STENT INTERVENTION (PCI-S);  Surgeon: Lorretta Harp, MD;  2.25 x 12 mm Xience DES to RCA    . PERCUTANEOUS CORONARY STENT INTERVENTION (PCI-S) N/A 01/06/2015   Procedure: PERCUTANEOUS CORONARY STENT INTERVENTION (PCI-S);  Surgeon: Lorretta Harp, MD; RCA OK, CFX unsuccessfult PCI 2nd distal dissection       Medications: Current Meds  Medication Sig  . amLODipine (NORVASC) 5 MG tablet TAKE 1 TABLET DAILY  . aspirin 81 MG tablet Take 1 tablet (81 mg total) by mouth daily.  Marland Kitchen atorvastatin (LIPITOR) 80 MG tablet TAKE 1 TABLET DAILY  . BRILINTA 60 MG TABS tablet TAKE 1 TABLET TWICE A DAY  . dextrose 5 % SOLN 50 mL with methotrexate 25 MG/ML (PF) SOLN 40 mg/m2 Inject into the vein once a week. 1 ml weekly  . Dietary Management Product (RHEUMATE) CAPS Take 1 capsule by mouth daily.  . folic acid (FOLVITE) 1 MG tablet Take 1 mg by mouth 2 (two) times daily.   . hydroxychloroquine (PLAQUENIL) 200 MG tablet take one tablet twice a day with food or milk  . Melatonin 10 MG CAPS Take 10 mg by mouth at bedtime.  . Methotrexate Sodium (METHOTREXATE, PF,) 50 MG/2ML injection once a week.  . metoprolol tartrate (LOPRESSOR) 25 MG tablet Take 0.5 tablets (12.5 mg total) by mouth 2 (two) times daily.  . nitroGLYCERIN (NITROSTAT) 0.4 MG SL tablet Place 1 tablet (0.4 mg total) under the tongue every 5 (five) minutes as needed for chest pain.  . predniSONE (DELTASONE) 5 MG tablet Take 2.5 mg by mouth 2 (two) times daily with a meal.  . traMADol (ULTRAM) 50 MG tablet take one tablet 4 times as needed for wrist pain/RA     Allergies: Allergies  Allergen Reactions  . Lisinopril Swelling  . Penicillins   . Sulfa Antibiotics     Parents both allergic and was told not to take sulfa drugs    Social History: The patient  reports that she has been smoking cigarettes. She has a 21.75 pack-year smoking history. She has never used smokeless tobacco. She reports current alcohol use. She reports that she does not use drugs.   Family History: The patient's family history includes Alzheimer's disease in her mother; Arrhythmia in her mother; COPD in her mother; Cancer - Colon in her father; Hyperlipidemia in her sister; Osteoarthritis in her mother.   Review of Systems: Please see the history of present illness.    All other systems are reviewed and negative.   Physical Exam: VS:  BP 124/72   Pulse 73   Ht 5\' 11"  (1.803 m)   Wt 159 lb 6.4 oz (72.3 kg)   SpO2 98%   BMI 22.23 kg/m  .  BMI Body mass index is 22.23 kg/m.  Wt Readings from Last 3 Encounters:  01/09/20 159 lb 6.4 oz (72.3 kg)  05/29/19 164 lb 6.4 oz (74.6 kg)  12/05/18 180 lb 1.9 oz (81.7 kg)    General: Pleasant. Alert and in no acute distress.   HEENT: Normal.  Neck: Supple, no JVD, carotid bruits, or masses noted.  Cardiac: Regular rate and rhythm. No murmurs, rubs, or gallops. No edema.  Respiratory:  Lungs are clear to auscultation bilaterally with normal work of breathing.  GI: Soft and nontender.  MS: No deformity or atrophy. Gait and ROM intact.  Skin: Warm and dry. Color is normal.  Neuro:  Strength and sensation are intact and no gross focal deficits noted.  Psych: Alert, appropriate and with normal affect.   LABORATORY DATA:  EKG:  EKG is not ordered today.   Lab Results  Component Value Date   WBC 9.6 05/29/2019   HGB 13.5 05/29/2019   HCT 39.3 05/29/2019   PLT 276 05/29/2019   GLUCOSE 93 05/29/2019   CHOL 134 05/29/2019   TRIG 100 05/29/2019   HDL 48 05/29/2019   LDLCALC 66 05/29/2019   ALT 18 05/29/2019   AST 16 05/29/2019   NA 137 05/29/2019   K 4.2 05/29/2019   CL 103 05/29/2019   CREATININE 0.78 05/29/2019   BUN 10 05/29/2019   CO2 19 (L) 05/29/2019   INR 1.04 01/05/2015   HGBA1C 5.6 01/05/2015     BNP (last 3 results) No results for input(s): BNP in the last 8760 hours.  ProBNP (last 3 results) No results for input(s): PROBNP in the last 8760 hours.   Other Studies Reviewed Today:  Carotid Summary 04/2019: Right Carotid: The ECA appears >50% stenosed. The extracranial vessels were near-normal with only minimal wall thickening or plaque. No plaque evident throughout the ICA, minimum wall thickening is noted in the proximal  segment.  Left Carotid: The ECA appears >50% stenosed. The extracranial vessels were near-normal with only minimal wall thickening or plaque. No plaque evident throughout the ICA.  Vertebrals: Bilateral vertebral arteries demonstrate antegrade flow. Small caliber left vertebral artery. Subclavians: Normal flow hemodynamics were seen in bilateral subclavian arteries.  *See table(s) above for measurements and observations.  Electronically signed by Lance Muss MD on 05/03/2019 at 4:29:34 PM.   Echo Study Conclusions 2016  - Left ventricle: Wall thickness was increased in a pattern of  moderate LVH. Systolic function was normal. The estimated  ejection fraction was in the range of 50% to 55%. Probable mild  hypokinesis of the inferior myocardium. Doppler parameters are  consistent with abnormal left ventricular relaxation (grade 1  diastolic dysfunction).  - Mitral valve: There was mild regurgitation.   Assessment/Plan:  1. CAD - prior inferior STEMI with DES x 2 to distal and prox RCA from 2016 - doing well - does have obstructive disease of the [facility] with prior attempt at staged PCT - resulted in dissection and attempt at re-crossing into the true lumen was not successful. She has moderate non obstructive disease of the LAD also. She is doing well. No chest pain or other worrisome symptoms. Continue with current regimen. Smoking cessation encouraged. She is on maintenance dose of Brilinta. She has been very successful with intentional weight loss.   2. HTN - BP is great - will continue on her current regimen.   3. HLD - on statin - needing labs today.   4. Tobacco abuse - total cessation encouraged.   5. Carotid disease - study from July very reassuring.   6. RA - on low dose steroid and Plaquenil  7. COVID-19 Education: The signs and symptoms of COVID-19 were discussed with the patient and how to seek  care for testing (follow up with PCP or arrange E-visit).  The importance of social distancing, staying at home, hand hygiene and wearing a mask when out in public were discussed today. She has had her first vaccine.   Current medicines are reviewed with the patient today.  The patient does not have concerns  regarding medicines other than what has been noted above.  The following changes have been made:  See above.  Labs/ tests ordered today include:    Orders Placed This Encounter  Procedures  . Basic metabolic panel  . CBC  . Hepatic function panel  . Lipid panel     Disposition:   FU with me in 6 months with fasting labs.    Patient is agreeable to this plan and will call if any problems develop in the interim.   SignedNorma Fredrickson, NP  01/09/2020 8:45 AM  [facility] [address] [place], [place]  [address] Phone: [phone] Fax: [phone]

## 2020-01-09 ENCOUNTER — Encounter: Payer: Self-pay | Admitting: Nurse Practitioner

## 2020-01-09 ENCOUNTER — Other Ambulatory Visit: Payer: Self-pay

## 2020-01-09 ENCOUNTER — Ambulatory Visit (INDEPENDENT_AMBULATORY_CARE_PROVIDER_SITE_OTHER): Payer: 59 | Admitting: Nurse Practitioner

## 2020-01-09 VITALS — BP 124/72 | HR 73 | Ht 71.0 in | Wt 159.4 lb

## 2020-01-09 DIAGNOSIS — E7800 Pure hypercholesterolemia, unspecified: Secondary | ICD-10-CM

## 2020-01-09 DIAGNOSIS — I259 Chronic ischemic heart disease, unspecified: Secondary | ICD-10-CM

## 2020-01-09 DIAGNOSIS — Z720 Tobacco use: Secondary | ICD-10-CM

## 2020-01-09 DIAGNOSIS — I10 Essential (primary) hypertension: Secondary | ICD-10-CM | POA: Diagnosis not present

## 2020-01-09 DIAGNOSIS — Z7189 Other specified counseling: Secondary | ICD-10-CM

## 2020-01-09 DIAGNOSIS — I6523 Occlusion and stenosis of bilateral carotid arteries: Secondary | ICD-10-CM

## 2020-01-09 LAB — HEPATIC FUNCTION PANEL
ALT: 19 IU/L (ref 0–32)
AST: 16 IU/L (ref 0–40)
Albumin: 4.2 g/dL (ref 3.8–4.9)
Alkaline Phosphatase: 95 IU/L (ref 39–117)
Bilirubin Total: 0.5 mg/dL (ref 0.0–1.2)
Bilirubin, Direct: 0.16 mg/dL (ref 0.00–0.40)
Total Protein: 6.8 g/dL (ref 6.0–8.5)

## 2020-01-09 LAB — BASIC METABOLIC PANEL
BUN/Creatinine Ratio: 17 (ref 9–23)
BUN: 13 mg/dL (ref 6–24)
CO2: 19 mmol/L — ABNORMAL LOW (ref 20–29)
Calcium: 9.7 mg/dL (ref 8.7–10.2)
Chloride: 105 mmol/L (ref 96–106)
Creatinine, Ser: 0.75 mg/dL (ref 0.57–1.00)
GFR calc Af Amer: 101 mL/min/{1.73_m2} (ref >59)
GFR calc non Af Amer: 88 mL/min/{1.73_m2} (ref >59)
Glucose: 83 mg/dL (ref 65–99)
Potassium: 4.1 mmol/L (ref 3.5–5.2)
Sodium: 140 mmol/L (ref 134–144)

## 2020-01-09 LAB — CBC
Hematocrit: 39.4 % (ref 34.0–46.6)
Hemoglobin: 13.7 g/dL (ref 11.1–15.9)
MCH: 32.2 pg (ref 26.6–33.0)
MCHC: 34.8 g/dL (ref 31.5–35.7)
MCV: 93 fL (ref 79–97)
Platelets: 256 10*3/uL (ref 150–450)
RBC: 4.25 x10E6/uL (ref 3.77–5.28)
RDW: 13.2 % (ref 11.7–15.4)
WBC: 7.9 10*3/uL (ref 3.4–10.8)

## 2020-01-09 LAB — LIPID PANEL
Chol/HDL Ratio: 3.1 ratio (ref 0.0–4.4)
Cholesterol, Total: 136 mg/dL (ref 100–199)
HDL: 44 mg/dL (ref >39)
LDL Chol Calc (NIH): 73 mg/dL (ref 0–99)
Triglycerides: 100 mg/dL (ref 0–149)
VLDL Cholesterol Cal: 19 mg/dL (ref 5–40)

## 2020-01-09 NOTE — Patient Instructions (Addendum)
After Visit Summary:  We will be checking the following labs today - BMET, CBC, HPF and lipids   Medication Instructions:    Continue with your current medicines.    If you need a refill on your cardiac medications before your next appointment, please call your pharmacy.     Testing/Procedures To Be Arranged:  N/A  Follow-Up:   See me in 6 months with fasting labs    At [facility], you and your health needs are our priority.  As part of our continuing mission to provide you with exceptional heart care, we have created designated Provider Care Teams.  These Care Teams include your primary Cardiologist (physician) and Advanced Practice Providers (APPs -  Physician Assistants and Nurse Practitioners) who all work together to provide you with the care you need, when you need it.  Special Instructions:  . Stay safe, stay home, wash your hands for at least 20 seconds and wear a mask when out in public.  . It was good to talk with you today.    Call the [facility] office at [phone] if you have any questions, problems or concerns.       

## 2020-01-20 ENCOUNTER — Other Ambulatory Visit: Payer: Self-pay | Admitting: Nurse Practitioner

## 2020-06-18 NOTE — Progress Notes (Deleted)
CARDIOLOGY OFFICE NOTE  Date:  06/18/2020    Sara Trujillo Date of Birth: [birth year] Medical Record #[id number]  PCP:  Sara Scarce, MD  Cardiologist:  Sara Trujillo  No chief complaint on file.   History of Present Illness: Sara Trujillo is a 59 y.o. female who presents today for a follow up visit. Seen for Dr. Excell Trujillo.She has primarily followed with me.  She has history of tobacco abuse, CAD with inferior STEMI, HTN, hyperlipidemia, GERD, anxiety/depression and bipolar disorder.She has had angioedema with ACE inhibitors.  She was admitted 3/19-3/23 of 2016 with an inferior STEMI treated with DES x 2 to distal and prox RCA.She was noted to have obstructive disease of the mCx and planned to have staged PCI. This was attempted 01/06/15 but there was difficulty crossing the lesion and resulted in a dissection and attempt at re-crossing into the true lumen was unsuccessful. Of note, she also has moderate non-obstructive disease of LAD.A 2D Echo 01/06/15: EF 50-55%, probable mild HK of inferior myocardium, grade 1 DD, mild MR. She was discharged with standard MI therapy and did well until 3/25, when she had severe emotional upset related to her dog's illness (the dog passed away).In that setting, she developed severe c/p and presented to [facility] where ECG was non-acute but troponin was elevated. Her Troponin with her MI was 59, down to 9 at discharge. It was 3.25 on 01/10/15 and falling to 2 by discharge the next day.She was then seen if f/u 01/17/15 by Sara Trujillo and was doing OK. Lots of stress at home- "my husband is dying of cancer". She again presented to the ED 01/25/15 with chest discomfort "my chest just doesn't feel right". Her Troponin was negative then and she was reassured.   I havefollowed hersince - her husband passed. Shecontinues to smoke.She had lost weight then gained back. BP high and I increased her ACE. Depression had been an ongoing issue.  Diagnosed with RA and on chronicprednisone andPlaquenil.Last seen by me back inMarch - she was doing well - had lost weight - feeling good.   Comes in today. Here with   Past Medical History:  Diagnosis Date  . Anxiety   . Arthritis   . Bipolar disorder (HCC)   . CAD (coronary artery disease)    a. 12/2014 Inferior Inf STEMI/PCI: LM nl, LAD 60p, D1 50-60p, LCX nondom 70m, RI 80,m RCA dom, 100p (DES to prox and distal RCA), EF 60%;  b 12/2014 Staged attempted PCI of AV groove LCX with resultant dissection->aborted;  c. 12/2014 Echo: EF 50-55%, mod LVH, mild inf HK, Gr 1 DD, mild MR.  . Daily headache   . Depression   . GERD (gastroesophageal reflux disease)   . Hyperlipidemia   . Hypertension   . Tobacco abuse     Past Surgical History:  Procedure Laterality Date  . LAPAROSCOPIC CHOLECYSTECTOMY  1990's  . LEFT HEART CATHETERIZATION WITH CORONARY ANGIOGRAM N/A 01/04/2015   Procedure: LEFT HEART CATHETERIZATION WITH CORONARY ANGIOGRAM;  Surgeon: Sara Gess, MD; LAD 60%, D1 60%, CFX 90%, RI 80%, RCA 100%>>0% w/  2.25 x 12 mm Xience DES, EF 60%    . PERCUTANEOUS CORONARY STENT INTERVENTION (PCI-S)  01/04/2015   Procedure: PERCUTANEOUS CORONARY STENT INTERVENTION (PCI-S);  Surgeon: Sara Gess, MD;  2.25 x 12 mm Xience DES to RCA    . PERCUTANEOUS CORONARY STENT INTERVENTION (PCI-S) N/A 01/06/2015   Procedure: PERCUTANEOUS CORONARY STENT INTERVENTION (PCI-S);  Surgeon: Sara See  Sara Sabal, MD; RCA OK, CFX unsuccessfult PCI 2nd distal dissection      Medications: No outpatient medications have been marked as taking for the 07/02/20 encounter (Appointment) with Sara Macadamia, NP.     Allergies: Allergies  Allergen Reactions  . Lisinopril Swelling  . Penicillins   . Sulfa Antibiotics     Parents both allergic and was told not to take sulfa drugs    Social History: The patient  reports that she has been smoking cigarettes. She has a 21.75 pack-year smoking history. She  has never used smokeless tobacco. She reports current alcohol use. She reports that she does not use drugs.   Family History: The patient's ***family history includes Alzheimer's disease in her mother; Arrhythmia in her mother; COPD in her mother; Cancer - Colon in her father; Hyperlipidemia in her sister; Osteoarthritis in her mother.   Review of Systems: Please see the history of present illness.   All other systems are reviewed and negative.   Physical Exam: VS:  There were no vitals taken for this visit. Marland Kitchen  BMI There is no height or weight on file to calculate BMI.  Wt Readings from Last 3 Encounters:  01/09/20 159 lb 6.4 oz (72.3 kg)  05/29/19 164 lb 6.4 oz (74.6 kg)  12/05/18 180 lb 1.9 oz (81.7 kg)    General: Pleasant. Well developed, well nourished and in no acute distress.   HEENT: Normal.  Neck: Supple, no JVD, carotid bruits, or masses noted.  Cardiac: ***Regular rate and rhythm. No murmurs, rubs, or gallops. No edema.  Respiratory:  Lungs are clear to auscultation bilaterally with normal work of breathing.  GI: Soft and nontender.  MS: No deformity or atrophy. Gait and ROM intact.  Skin: Warm and dry. Color is normal.  Neuro:  Strength and sensation are intact and no gross focal deficits noted.  Psych: Alert, appropriate and with normal affect.   LABORATORY DATA:  EKG:  EKG {ACTION; IS/IS [id number]} ordered today.  Personally reviewed by me. This demonstrates ***.  Lab Results  Component Value Date   WBC 7.9 01/09/2020   HGB 13.7 01/09/2020   HCT 39.4 01/09/2020   PLT 256 01/09/2020   GLUCOSE 83 01/09/2020   CHOL 136 01/09/2020   TRIG 100 01/09/2020   HDL 44 01/09/2020   LDLCALC 73 01/09/2020   ALT 19 01/09/2020   AST 16 01/09/2020   NA 140 01/09/2020   K 4.1 01/09/2020   CL 105 01/09/2020   CREATININE 0.75 01/09/2020   BUN 13 01/09/2020   CO2 19 (L) 01/09/2020   INR 1.04 01/05/2015   HGBA1C 5.6 01/05/2015     BNP (last 3 results) No results  for input(s): BNP in the last 8760 hours.  ProBNP (last 3 results) No results for input(s): PROBNP in the last 8760 hours.   Other Studies Reviewed Today:  Carotid Summary 04/2019: Right Carotid: The ECA appears >50% stenosed. The extracranial vessels were near-normal with only minimal wall thickening or plaque. No plaque evident throughout the ICA, minimum wall thickening is noted in the proximal segment.  Left Carotid: The ECA appears >50% stenosed. The extracranial vessels were near-normal with only minimal wall thickening or plaque. No plaque evident throughout the ICA.  Vertebrals: Bilateral vertebral arteries demonstrate antegrade flow. Small caliber left vertebral artery. Subclavians: Normal flow hemodynamics were seen in bilateral subclavian arteries.  *See table(s) above for measurements and observations.  Electronically signed by Lance Muss MD on 05/03/2019 at 4:29:34 PM.  EchoStudy Conclusions2016  - Left ventricle: Wall thickness was increased in a pattern of  moderate LVH. Systolic function was normal. The estimated  ejection fraction was in the range of 50% to 55%. Probable mild  hypokinesis of the inferior myocardium. Doppler parameters are  consistent with abnormal left ventricular relaxation (grade 1  diastolic dysfunction).  - Mitral valve: There was mild regurgitation.    Assessment/Plan:  1. CAD - prior inferior STEMI with DES x 2 to distal and prox RCA from 2016 - doing well - does have obstructive disease of the [facility] with prior attempt at staged PCT - resulted in dissection and attempt at re-crossing into the true lumen was not successful. She has moderate non obstructive disease of the LAD also. She is doing well. No chest pain or other worrisome symptoms. Continue with current regimen. Smoking cessation encouraged. She is on  maintenance dose of Brilinta. She has been very successful with intentional weight loss.   2. HTN - BP is great - will continue on her current regimen.   3. HLD - on statin - needing labs today.   4. Tobacco abuse - total cessation encouraged.   5. Carotid disease - study from July very reassuring.   6. RA - on low dose steroid and Plaquenil  Current medicines are reviewed with the patient today.  The patient does not have concerns regarding medicines other than what has been noted above.  The following changes have been made:  See above.  Labs/ tests ordered today include:   No orders of the defined types were placed in this encounter.    Disposition:   FU with *** in {gen number [id number]} {Days to years:10300}.   Patient is agreeable to this plan and will call if any problems develop in the interim.   SignedNorma Fredrickson, NP  06/18/2020 7:47 AM  [facility] [address] [place], [place]  [address] Phone: [phone] Fax: [phone]

## 2020-07-02 ENCOUNTER — Ambulatory Visit: Payer: 59 | Admitting: Nurse Practitioner

## 2020-07-14 NOTE — Progress Notes (Deleted)
CARDIOLOGY OFFICE NOTE  Date:  07/14/2020    Sara Trujillo Date of Birth: September 13, 1961 Medical Record #[id number]  PCP:  Gillian Scarce, MD  Cardiologist:  Tyrone Sage & ***    No chief complaint on file.   History of Present Illness: Sara Trujillo is a 60 y.o. female who presents today for a follow up visit. Seen for Dr. Excell Seltzer.She has primarily followed with me.  She has history of tobacco abuse, CAD with inferior STEMI, HTN, hyperlipidemia, GERD, anxiety/depression and bipolar disorder.She has had angioedema with ACE inhibitors.  She was admitted 3/19-3/23 of 2016 with an inferior STEMI treated with DES x 2 to distal and prox RCA.She was noted to have obstructive disease of the mCx and planned to have staged PCI. This was attempted 01/06/15 but there was difficulty crossing the lesion and resulted in a dissection and attempt at re-crossing into the true lumen was unsuccessful. Of note, she also has moderate non-obstructive disease of LAD.A 2D Echo 01/06/15: EF 50-55%, probable mild HK of inferior myocardium, grade 1 DD, mild MR. She was discharged with standard MI therapy and did well until 3/25, when she had severe emotional upset related to her dog's illness (the dog passed away).In that setting, she developed severe c/p and presented to [facility] where ECG was non-acute but troponin was elevated. Her Troponin with her MI was 59, down to 9 at discharge. It was 3.25 on 01/10/15 and falling to 2 by discharge the next day.She was then seen if f/u 01/17/15 by Ronie Spies and was doing OK. Lots of stress at home- "my husband is dying of cancer". She again presented to the ED 01/25/15 with chest discomfort "my chest just doesn't feel right". Her Troponin was negative then and she was reassured.   I havefollowed hersince - her husband passed. Shecontinues to smoke.She had lost weight then gained back. BP high and I increased her ACE. Depression had been an ongoing issue.  Diagnosed with RA and on chronicprednisone andPlaquenil.When seen by me back inAugust of 2020 and was doing well - losing weight - no cardiac issues. Carotids had been updated. Last seen in March and she continued to do well - actively losing weight. Still smoking.   Comes in today. Here with   Past Medical History:  Diagnosis Date  . Anxiety   . Arthritis   . Bipolar disorder (HCC)   . CAD (coronary artery disease)    a. 12/2014 Inferior Inf STEMI/PCI: LM nl, LAD 60p, D1 50-60p, LCX nondom 59m, RI 80,m RCA dom, 100p (DES to prox and distal RCA), EF 60%;  b 12/2014 Staged attempted PCI of AV groove LCX with resultant dissection->aborted;  c. 12/2014 Echo: EF 50-55%, mod LVH, mild inf HK, Gr 1 DD, mild MR.  . Daily headache   . Depression   . GERD (gastroesophageal reflux disease)   . Hyperlipidemia   . Hypertension   . Tobacco abuse     Past Surgical History:  Procedure Laterality Date  . LAPAROSCOPIC CHOLECYSTECTOMY  1990's  . LEFT HEART CATHETERIZATION WITH CORONARY ANGIOGRAM N/A 01/04/2015   Procedure: LEFT HEART CATHETERIZATION WITH CORONARY ANGIOGRAM;  Surgeon: Runell Gess, MD; LAD 60%, D1 60%, CFX 90%, RI 80%, RCA 100%>>0% w/  2.25 x 12 mm Xience DES, EF 60%    . PERCUTANEOUS CORONARY STENT INTERVENTION (PCI-S)  01/04/2015   Procedure: PERCUTANEOUS CORONARY STENT INTERVENTION (PCI-S);  Surgeon: Runell Gess, MD;  2.25 x 12 mm Xience DES to RCA    .  PERCUTANEOUS CORONARY STENT INTERVENTION (PCI-S) N/A 01/06/2015   Procedure: PERCUTANEOUS CORONARY STENT INTERVENTION (PCI-S);  Surgeon: Runell Gess, MD; RCA OK, CFX unsuccessfult PCI 2nd distal dissection      Medications: No outpatient medications have been marked as taking for the 07/21/20 encounter (Appointment) with Rosalio Macadamia, NP.     Allergies: Allergies  Allergen Reactions  . Lisinopril Swelling  . Penicillins   . Sulfa Antibiotics     Parents both allergic and was told not to take sulfa drugs     Social History: The patient  reports that she has been smoking cigarettes. She has a 21.75 pack-year smoking history. She has never used smokeless tobacco. She reports current alcohol use. She reports that she does not use drugs.   Family History: The patient's ***family history includes Alzheimer's disease in her mother; Arrhythmia in her mother; COPD in her mother; Cancer - Colon in her father; Hyperlipidemia in her sister; Osteoarthritis in her mother.   Review of Systems: Please see the history of present illness.   All other systems are reviewed and negative.   Physical Exam: VS:  There were no vitals taken for this visit. Marland Kitchen  BMI There is no height or weight on file to calculate BMI.  Wt Readings from Last 3 Encounters:  01/09/20 159 lb 6.4 oz (72.3 kg)  05/29/19 164 lb 6.4 oz (74.6 kg)  12/05/18 180 lb 1.9 oz (81.7 kg)    General: Pleasant. Well developed, well nourished and in no acute distress.   HEENT: Normal.  Neck: Supple, no JVD, carotid bruits, or masses noted.  Cardiac: ***Regular rate and rhythm. No murmurs, rubs, or gallops. No edema.  Respiratory:  Lungs are clear to auscultation bilaterally with normal work of breathing.  GI: Soft and nontender.  MS: No deformity or atrophy. Gait and ROM intact.  Skin: Warm and dry. Color is normal.  Neuro:  Strength and sensation are intact and no gross focal deficits noted.  Psych: Alert, appropriate and with normal affect.   LABORATORY DATA:  EKG:  EKG {ACTION; IS/IS [id number]} ordered today.  Personally reviewed by me. This demonstrates ***.  Lab Results  Component Value Date   WBC 7.9 01/09/2020   HGB 13.7 01/09/2020   HCT 39.4 01/09/2020   PLT 256 01/09/2020   GLUCOSE 83 01/09/2020   CHOL 136 01/09/2020   TRIG 100 01/09/2020   HDL 44 01/09/2020   LDLCALC 73 01/09/2020   ALT 19 01/09/2020   AST 16 01/09/2020   NA 140 01/09/2020   K 4.1 01/09/2020   CL 105 01/09/2020   CREATININE 0.75 01/09/2020   BUN  13 01/09/2020   CO2 19 (L) 01/09/2020   INR 1.04 01/05/2015   HGBA1C 5.6 01/05/2015     BNP (last 3 results) No results for input(s): BNP in the last 8760 hours.  ProBNP (last 3 results) No results for input(s): PROBNP in the last 8760 hours.   Other Studies Reviewed Today:  Carotid Summary 04/2019: Right Carotid: The ECA appears >50% stenosed. The extracranial vessels were near-normal with only minimal wall thickening or plaque. No plaque evident throughout the ICA, minimum wall thickening is noted in the proximal segment.  Left Carotid: The ECA appears >50% stenosed. The extracranial vessels were near-normal with only minimal wall thickening or plaque. No plaque evident throughout the ICA.  Vertebrals: Bilateral vertebral arteries demonstrate antegrade flow. Small caliber left vertebral artery. Subclavians: Normal flow hemodynamics were seen in bilateral subclavian arteries.  *See  table(s) above for measurements and observations.  Electronically signed by Lance Muss MD on 05/03/2019 at 4:29:34 PM.   EchoStudy Conclusions2016  - Left ventricle: Wall thickness was increased in a pattern of  moderate LVH. Systolic function was normal. The estimated  ejection fraction was in the range of 50% to 55%. Probable mild  hypokinesis of the inferior myocardium. Doppler parameters are  consistent with abnormal left ventricular relaxation (grade 1  diastolic dysfunction).  - Mitral valve: There was mild regurgitation.   Assessment/Plan:  1. CAD - prior inferior STEMI with DES x 2 to distal and prox RCA from 2016 - doing well - does have obstructive disease of the [facility] with prior attempt at staged PCT - resulted in dissection and attempt at re-crossing into the true lumen was not successful. She has moderate non obstructive disease of the LAD also. She is  doing well. No chest pain or other worrisome symptoms. Continue with current regimen. Smoking cessation encouraged. She is on maintenance dose of Brilinta. She has been very successful with intentional weight loss.   2. HTN - BP is great - will continue on her current regimen.   3. HLD - on statin - needing labs today.   4. Tobacco abuse - total cessation encouraged.   5. Carotid disease - study from July very reassuring.   6. RA - on low dose steroid and Plaquenil    Current medicines are reviewed with the patient today.  The patient does not have concerns regarding medicines other than what has been noted above.  The following changes have been made:  See above.  Labs/ tests ordered today include:   No orders of the defined types were placed in this encounter.    Disposition:   FU with *** in {gen number [id number]} {Days to years:10300}.   Patient is agreeable to this plan and will call if any problems develop in the interim.   SignedNorma Fredrickson, NP  07/14/2020 7:11 AM  [facility] [address] [place], [place]  [address] Phone: [phone] Fax: [phone]

## 2020-07-21 ENCOUNTER — Ambulatory Visit: Payer: 59 | Admitting: Nurse Practitioner

## 2020-08-12 NOTE — Progress Notes (Deleted)
CARDIOLOGY OFFICE NOTE  Date:  08/12/2020    Sara Trujillo Date of Birth: [birth year] Medical Record #[id number]  PCP:  Gillian Scarce, MD  Cardiologist:  Tyrone Sage & ***    No chief complaint on file.   History of Present Illness: Sara Trujillo is a 59 y.o. female who presents today for a ***  Seen for Dr. Excell Seltzer.She has primarily followed with me.  She has history of tobacco abuse, CAD with inferior STEMI, HTN, hyperlipidemia, GERD, anxiety/depression and bipolar disorder.She has had angioedema with ACE inhibitors.  She was admitted 3/19-3/23 of 2016 with an inferior STEMI treated with DES x 2 to distal and prox RCA.She was noted to have obstructive disease of the mCx and planned to have staged PCI. This was attempted 01/06/15 but there was difficulty crossing the lesion and resulted in a dissection and attempt at re-crossing into the true lumen was unsuccessful. Of note, she also has moderate non-obstructive disease of LAD.A 2D Echo 01/06/15: EF 50-55%, probable mild HK of inferior myocardium, grade 1 DD, mild MR. She was discharged with standard MI therapy and did well until 3/25, when she had severe emotional upset related to her dog's illness (the dog passed away).In that setting, she developed severe c/p and presented to [facility] where ECG was non-acute but troponin was elevated. Her Troponin with her MI was 59, down to 9 at discharge. It was 3.25 on 01/10/15 and falling to 2 by discharge the next day.She was then seen if f/u 01/17/15 by Ronie Spies and was doing OK. Lots of stress at home- "my husband is dying of cancer". She again presented to the ED 01/25/15 with chest discomfort "my chest just doesn't feel right". Her Troponin was negative then and she was reassured.   I havefollowed hersince - her husband passed. Shecontinues to smoke.She had lost weight then gained back. BP high and I increased her ACE. Depression had been an ongoing issue.  Diagnosed with RA and on chronicprednisone andPlaquenil.Last seen by me back inAugust of 2020 and was doing well - losing weight - no cardiac issues. Carotids had been updated.   The patient does not have symptoms concerning for COVID-19 infection (fever, chills, cough, or new shortness of breath).   Comes in today. Here alone. She is doing ok. Continues to be successful with losing weight. No chest pain. Breathing is good. Not dizzy or lightheaded. Still smoking - does not quantify. BP is lower. She feels like she is doing well overall.   Comes in today. Here with   Past Medical History:  Diagnosis Date  . Anxiety   . Arthritis   . Bipolar disorder (HCC)   . CAD (coronary artery disease)    a. 12/2014 Inferior Inf STEMI/PCI: LM nl, LAD 60p, D1 50-60p, LCX nondom 39m, RI 80,m RCA dom, 100p (DES to prox and distal RCA), EF 60%;  b 12/2014 Staged attempted PCI of AV groove LCX with resultant dissection->aborted;  c. 12/2014 Echo: EF 50-55%, mod LVH, mild inf HK, Gr 1 DD, mild MR.  . Daily headache   . Depression   . GERD (gastroesophageal reflux disease)   . Hyperlipidemia   . Hypertension   . Tobacco abuse     Past Surgical History:  Procedure Laterality Date  . LAPAROSCOPIC CHOLECYSTECTOMY  1990's  . LEFT HEART CATHETERIZATION WITH CORONARY ANGIOGRAM N/A 01/04/2015   Procedure: LEFT HEART CATHETERIZATION WITH CORONARY ANGIOGRAM;  Surgeon: Runell Gess, MD; LAD 60%, D1 60%, CFX 90%,  RI 80%, RCA 100%>>0% w/  2.25 x 12 mm Xience DES, EF 60%    . PERCUTANEOUS CORONARY STENT INTERVENTION (PCI-S)  01/04/2015   Procedure: PERCUTANEOUS CORONARY STENT INTERVENTION (PCI-S);  Surgeon: Runell Gess, MD;  2.25 x 12 mm Xience DES to RCA    . PERCUTANEOUS CORONARY STENT INTERVENTION (PCI-S) N/A 01/06/2015   Procedure: PERCUTANEOUS CORONARY STENT INTERVENTION (PCI-S);  Surgeon: Runell Gess, MD; RCA OK, CFX unsuccessfult PCI 2nd distal dissection      Medications: No outpatient  medications have been marked as taking for the 08/26/20 encounter (Appointment) with Rosalio Macadamia, NP.     Allergies: Allergies  Allergen Reactions  . Lisinopril Swelling  . Penicillins   . Sulfa Antibiotics     Parents both allergic and was told not to take sulfa drugs    Social History: The patient  reports that she has been smoking cigarettes. She has a 21.75 pack-year smoking history. She has never used smokeless tobacco. She reports current alcohol use. She reports that she does not use drugs.   Family History: The patient's ***family history includes Alzheimer's disease in her mother; Arrhythmia in her mother; COPD in her mother; Cancer - Colon in her father; Hyperlipidemia in her sister; Osteoarthritis in her mother.   Review of Systems: Please see the history of present illness.   All other systems are reviewed and negative.   Physical Exam: VS:  There were no vitals taken for this visit. Marland Kitchen  BMI There is no height or weight on file to calculate BMI.  Wt Readings from Last 3 Encounters:  01/09/20 159 lb 6.4 oz (72.3 kg)  05/29/19 164 lb 6.4 oz (74.6 kg)  12/05/18 180 lb 1.9 oz (81.7 kg)    General: Pleasant. Well developed, well nourished and in no acute distress.   HEENT: Normal.  Neck: Supple, no JVD, carotid bruits, or masses noted.  Cardiac: ***Regular rate and rhythm. No murmurs, rubs, or gallops. No edema.  Respiratory:  Lungs are clear to auscultation bilaterally with normal work of breathing.  GI: Soft and nontender.  MS: No deformity or atrophy. Gait and ROM intact.  Skin: Warm and dry. Color is normal.  Neuro:  Strength and sensation are intact and no gross focal deficits noted.  Psych: Alert, appropriate and with normal affect.   LABORATORY DATA:  EKG:  EKG {ACTION; IS/IS VOH:[id number]} ordered today.  Personally reviewed by me. This demonstrates ***.  Lab Results  Component Value Date   WBC 7.9 01/09/2020   HGB 13.7 01/09/2020   HCT 39.4  01/09/2020   PLT 256 01/09/2020   GLUCOSE 83 01/09/2020   CHOL 136 01/09/2020   TRIG 100 01/09/2020   HDL 44 01/09/2020   LDLCALC 73 01/09/2020   ALT 19 01/09/2020   AST 16 01/09/2020   NA 140 01/09/2020   K 4.1 01/09/2020   CL 105 01/09/2020   CREATININE 0.75 01/09/2020   BUN 13 01/09/2020   CO2 19 (L) 01/09/2020   INR 1.04 01/05/2015   HGBA1C 5.6 01/05/2015     BNP (last 3 results) No results for input(s): BNP in the last 8760 hours.  ProBNP (last 3 results) No results for input(s): PROBNP in the last 8760 hours.   Other Studies Reviewed Today:   Assessment/Plan:  Carotid Summary 04/2019: Right Carotid: The ECA appears >50% stenosed. The extracranial vessels were near-normal with only minimal wall thickening or plaque. No plaque evident throughout the ICA, minimum wall thickening is noted  in the proximal segment.  Left Carotid: The ECA appears >50% stenosed. The extracranial vessels were near-normal with only minimal wall thickening or plaque. No plaque evident throughout the ICA.  Vertebrals: Bilateral vertebral arteries demonstrate antegrade flow. Small caliber left vertebral artery. Subclavians: Normal flow hemodynamics were seen in bilateral subclavian arteries.  *See table(s) above for measurements and observations.  Electronically signed by Lance Muss MD on 05/03/2019 at 4:29:34 PM.   EchoStudy Conclusions2016  - Left ventricle: Wall thickness was increased in a pattern of  moderate LVH. Systolic function was normal. The estimated  ejection fraction was in the range of 50% to 55%. Probable mild  hypokinesis of the inferior myocardium. Doppler parameters are  consistent with abnormal left ventricular relaxation (grade 1  diastolic dysfunction).  - Mitral valve: There was mild regurgitation.   Assessment/Plan:  1. CAD -  prior inferior STEMI with DES x 2 to distal and prox RCA from 2016 - doing well - does have obstructive disease of the [facility] with prior attempt at staged PCT - resulted in dissection and attempt at re-crossing into the true lumen was not successful. She has moderate non obstructive disease of the LAD also. She is doing well. No chest pain or other worrisome symptoms. Continue with current regimen. Smoking cessation encouraged. She is on maintenance dose of Brilinta. She has been very successful with intentional weight loss.   2. HTN - BP is great - will continue on her current regimen.   3. HLD - on statin - needing labs today.   4. Tobacco abuse - total cessation encouraged.   5. Carotid disease - study from July very reassuring.   6. RA - on low dose steroid and Plaquenil  Current medicines are reviewed with the patient today.  The patient does not have concerns regarding medicines other than what has been noted above.  The following changes have been made:  See above.  Labs/ tests ordered today include:   No orders of the defined types were placed in this encounter.    Disposition:   FU with *** in {gen number 5-00:938182} {Days to years:10300}.   Patient is agreeable to this plan and will call if any problems develop in the interim.   SignedNorma Fredrickson, NP  08/12/2020 7:54 AM  [facility] [address] [place], [place]  [address] Phone: [phone] Fax: [phone]

## 2020-08-25 NOTE — Progress Notes (Deleted)
CARDIOLOGY OFFICE NOTE  Date:  08/25/2020    Sara Trujillo Date of Birth: December 23, 1960 Medical Record #[id number]  PCP:  Sara Scarce, MD  Cardiologist:  Tyrone Sage & ***    No chief complaint on file.   History of Present Illness: Sara Trujillo is a 59 y.o. female who presents today for a ***    Comes in today. Here with   Past Medical History:  Diagnosis Date  . Anxiety   . Arthritis   . Bipolar disorder (HCC)   . CAD (coronary artery disease)    a. 12/2014 Inferior Inf STEMI/PCI: LM nl, LAD 60p, D1 50-60p, LCX nondom 4m, RI 80,m RCA dom, 100p (DES to prox and distal RCA), EF 60%;  b 12/2014 Staged attempted PCI of AV groove LCX with resultant dissection->aborted;  c. 12/2014 Echo: EF 50-55%, mod LVH, mild inf HK, Gr 1 DD, mild MR.  . Daily headache   . Depression   . GERD (gastroesophageal reflux disease)   . Hyperlipidemia   . Hypertension   . Tobacco abuse     Past Surgical History:  Procedure Laterality Date  . LAPAROSCOPIC CHOLECYSTECTOMY  1990's  . LEFT HEART CATHETERIZATION WITH CORONARY ANGIOGRAM N/A 01/04/2015   Procedure: LEFT HEART CATHETERIZATION WITH CORONARY ANGIOGRAM;  Surgeon: Runell Gess, MD; LAD 60%, D1 60%, CFX 90%, RI 80%, RCA 100%>>0% w/  2.25 x 12 mm Xience DES, EF 60%    . PERCUTANEOUS CORONARY STENT INTERVENTION (PCI-S)  01/04/2015   Procedure: PERCUTANEOUS CORONARY STENT INTERVENTION (PCI-S);  Surgeon: Runell Gess, MD;  2.25 x 12 mm Xience DES to RCA    . PERCUTANEOUS CORONARY STENT INTERVENTION (PCI-S) N/A 01/06/2015   Procedure: PERCUTANEOUS CORONARY STENT INTERVENTION (PCI-S);  Surgeon: Runell Gess, MD; RCA OK, CFX unsuccessfult PCI 2nd distal dissection      Medications: No outpatient medications have been marked as taking for the 09/08/20 encounter (Appointment) with Rosalio Macadamia, NP.     Allergies: Allergies  Allergen Reactions  . Lisinopril Swelling  . Penicillins   . Sulfa Antibiotics      Parents both allergic and was told not to take sulfa drugs    Social History: The patient  reports that she has been smoking cigarettes. She has a 21.75 pack-year smoking history. She has never used smokeless tobacco. She reports current alcohol use. She reports that she does not use drugs.   Family History: The patient's ***family history includes Alzheimer's disease in her mother; Arrhythmia in her mother; COPD in her mother; Cancer - Colon in her father; Hyperlipidemia in her sister; Osteoarthritis in her mother.   Review of Systems: Please see the history of present illness.   All other systems are reviewed and negative.   Physical Exam: VS:  There were no vitals taken for this visit. Marland Kitchen  BMI There is no height or weight on file to calculate BMI.  Wt Readings from Last 3 Encounters:  01/09/20 159 lb 6.4 oz (72.3 kg)  05/29/19 164 lb 6.4 oz (74.6 kg)  12/05/18 180 lb 1.9 oz (81.7 kg)    General: Pleasant. Well developed, well nourished and in no acute distress.   HEENT: Normal.  Neck: Supple, no JVD, carotid bruits, or masses noted.  Cardiac: ***Regular rate and rhythm. No murmurs, rubs, or gallops. No edema.  Respiratory:  Lungs are clear to auscultation bilaterally with normal work of breathing.  GI: Soft and nontender.  MS: No deformity or atrophy. Gait and  ROM intact.  Skin: Warm and dry. Color is normal.  Neuro:  Strength and sensation are intact and no gross focal deficits noted.  Psych: Alert, appropriate and with normal affect.   LABORATORY DATA:  EKG:  EKG {ACTION; IS/IS YPP:[id number]} ordered today.  Personally reviewed by me. This demonstrates ***.  Lab Results  Component Value Date   WBC 7.9 01/09/2020   HGB 13.7 01/09/2020   HCT 39.4 01/09/2020   PLT 256 01/09/2020   GLUCOSE 83 01/09/2020   CHOL 136 01/09/2020   TRIG 100 01/09/2020   HDL 44 01/09/2020   LDLCALC 73 01/09/2020   ALT 19 01/09/2020   AST 16 01/09/2020   NA 140 01/09/2020   K 4.1 01/09/2020    CL 105 01/09/2020   CREATININE 0.75 01/09/2020   BUN 13 01/09/2020   CO2 19 (L) 01/09/2020   INR 1.04 01/05/2015   HGBA1C 5.6 01/05/2015     BNP (last 3 results) No results for input(s): BNP in the last 8760 hours.  ProBNP (last 3 results) No results for input(s): PROBNP in the last 8760 hours.   Other Studies Reviewed Today:   Assessment/Plan:    Current medicines are reviewed with the patient today.  The patient does not have concerns regarding medicines other than what has been noted above.  The following changes have been made:  See above.  Labs/ tests ordered today include:   No orders of the defined types were placed in this encounter.    Disposition:   FU with *** in {gen number [id number]} {Days to years:10300}.   Patient is agreeable to this plan and will call if any problems develop in the interim.   SignedNorma Fredrickson, NP  08/25/2020 7:37 AM  [facility] [address] [place], [place]  [address] Phone: [phone] Fax: [phone]

## 2020-08-25 NOTE — Progress Notes (Signed)
Telehealth Visit     Virtual Visit via Telephone Note   This visit type was conducted due to national recommendations for restrictions regarding the COVID-19 Pandemic (e.g. social distancing) in an effort to limit this patient's exposure and mitigate transmission in our community.  Due to her co-morbid illnesses, this patient is at least at moderate risk for complications without adequate follow up.  This format is felt to be most appropriate for this patient at this time.  The patient did not have access to video technology/had technical difficulties with video requiring transitioning to audio format only (telephone).  All issues noted in this document were discussed and addressed.  No physical exam could be performed with this format.  Please refer to the patient's chart for her  consent to telehealth for [facility].   Evaluation Performed:  Follow-up visit   The patient was identified using 2 identifiers.   This visit type was conducted due to national recommendations for restrictions regarding the COVID-19 Pandemic (e.g. social distancing).  This format is felt to be most appropriate for this patient at this time.  All issues noted in this document were discussed and addressed.  No physical exam was performed (except for noted visual exam findings with Video Visits).  Please refer to the patient's chart (MyChart message for video visits and phone note for telephone visits) for the patient's consent to telehealth for [facility].  Date:  09/08/2020   ID:  Sara Trujillo, DOB [birth year], MRN [id number]  Patient Location:  Home  Provider location:   [facility]  PCP:  Gillian ScarceZanard, Robyn K, MD  Cardiologist:  Kirt BoysGerhardt & Cooper Electrophysiologist:  None   Chief Complaint:  Follow Up  History of Present Illness:    Sara Trujillo is a 59 y.o. female who presents via audio/video conferencing for a telehealth visit today.  Seen for Dr. Excell Seltzerooper.She has primarily  followed with me.  She has history of tobacco abuse, CAD with inferior STEMI, HTN, hyperlipidemia, GERD, anxiety/depression and bipolar disorder.She has had angioedema with ACE inhibitors.  She was admitted 3/19-3/23 of 2016 with an inferior STEMI treated with DES x 2 to distal and prox RCA.She was noted to have obstructive disease of the mCx and planned to have staged PCI. This was attempted 01/06/15 but there was difficulty crossing the lesion and resulted in a dissection and attempt at re-crossing into the true lumen was unsuccessful. Of note, she also has moderate non-obstructive disease of LAD.A 2D Echo 01/06/15: EF 50-55%, probable mild HK of inferior myocardium, grade 1 DD, mild MR. She was discharged with standard MI therapy and did well until 3/25, when she had severe emotional upset related to her dog's illness (the dog passed away).In that setting, she developed severe c/p and [facility] where ECG was non-acute but troponin was elevated. Her Troponin with her MI was 59, down to 9 at discharge. It was 3.25 on 01/10/15 and falling to 2 by discharge the next day.She was then seen if f/u 01/17/15 by Ronie Spiesayna Dunn and was doing OK. Lots of stress at home- "my husband is dying of cancer". She again presented to the ED 01/25/15 with chest discomfort "my chest just doesn't feel right". Her Troponin was negative then and she was reassured.   I havefollowed hersince - her husband passed. Shecontinues to smoke.She had lost weight then gained back. BP high and I increased her ACE. Depression had been an ongoing issue. Diagnosed with RA and on chronicprednisone andPlaquenil.Last seen by  me back inMarch of 2021 - has done well - lost weight with the pandemic - working from home - but still smoking.   The patient does not have symptoms concerning for COVID-19 infection (fever, chills, cough, or new shortness of breath).   Seen today by telephone visit. She has consented for this  visit. Doing well. Not able to check BP today. Busy with work. More limited by her RA in her hands - otherwise this is stable. No chest pain. Not short of breath. Tolerating her medicines. She feels like she is doing well from our standpoint.    Past Medical History:  Diagnosis Date  . Anxiety   . Arthritis   . Bipolar disorder (HCC)   . CAD (coronary artery disease)    a. 12/2014 Inferior Inf STEMI/PCI: LM nl, LAD 60p, D1 50-60p, LCX nondom 27m, RI 80,m RCA dom, 100p (DES to prox and distal RCA), EF 60%;  b 12/2014 Staged attempted PCI of AV groove LCX with resultant dissection->aborted;  c. 12/2014 Echo: EF 50-55%, mod LVH, mild inf HK, Gr 1 DD, mild MR.  . Daily headache   . Depression   . GERD (gastroesophageal reflux disease)   . Hyperlipidemia   . Hypertension   . Tobacco abuse    Past Surgical History:  Procedure Laterality Date  . LAPAROSCOPIC CHOLECYSTECTOMY  1990's  . LEFT HEART CATHETERIZATION WITH CORONARY ANGIOGRAM N/A 01/04/2015   Procedure: LEFT HEART CATHETERIZATION WITH CORONARY ANGIOGRAM;  Surgeon: Runell Gess, MD; LAD 60%, D1 60%, CFX 90%, RI 80%, RCA 100%>>0% w/  2.25 x 12 mm Xience DES, EF 60%    . PERCUTANEOUS CORONARY STENT INTERVENTION (PCI-S)  01/04/2015   Procedure: PERCUTANEOUS CORONARY STENT INTERVENTION (PCI-S);  Surgeon: Runell Gess, MD;  2.25 x 12 mm Xience DES to RCA    . PERCUTANEOUS CORONARY STENT INTERVENTION (PCI-S) N/A 01/06/2015   Procedure: PERCUTANEOUS CORONARY STENT INTERVENTION (PCI-S);  Surgeon: Runell Gess, MD; RCA OK, CFX unsuccessfult PCI 2nd distal dissection      Current Meds  Medication Sig  . amLODipine (NORVASC) 5 MG tablet TAKE 1 TABLET DAILY  . aspirin 81 MG tablet Take 1 tablet (81 mg total) by mouth daily.  Marland Kitchen atorvastatin (LIPITOR) 80 MG tablet TAKE 1 TABLET DAILY  . BRILINTA 60 MG TABS tablet TAKE 1 TABLET TWICE A DAY  . dextrose 5 % SOLN 50 mL with methotrexate 25 MG/ML (PF) SOLN 40 mg/m2 Inject into the vein once a  week. 1 ml weekly  . Dietary Management Product (RHEUMATE) CAPS Take 1 capsule by mouth daily.  . folic acid (FOLVITE) 1 MG tablet Take 1 mg by mouth 2 (two) times daily.   . hydroxychloroquine (PLAQUENIL) 200 MG tablet take one tablet twice a day with food or milk  . Melatonin 10 MG CAPS Take 10 mg by mouth at bedtime.  . metoprolol tartrate (LOPRESSOR) 25 MG tablet TAKE ONE-HALF (1/2) TABLET TWICE A DAY  . nitroGLYCERIN (NITROSTAT) 0.4 MG SL tablet Place 1 tablet (0.4 mg total) under the tongue every 5 (five) minutes as needed for chest pain.  . predniSONE (DELTASONE) 5 MG tablet Take 2.5 mg by mouth 2 (two) times daily with a meal.  . traMADol (ULTRAM) 50 MG tablet take one tablet 4 times as needed for wrist pain/RA     Allergies:   Lisinopril, Penicillins, and Sulfa antibiotics   Social History   Tobacco Use  . Smoking status: Current Every Day Smoker  Packs/day: 0.75    Years: 29.00    Pack years: 21.75    Types: Cigarettes  . Smokeless tobacco: Never Used  Vaping Use  . Vaping Use: Never used  Substance Use Topics  . Alcohol use: Yes    Alcohol/week: 0.0 standard drinks    Comment: 01/07/2015 "might have a drink a few times/yr"  . Drug use: No     Family Hx: The patient's family history includes Alzheimer's disease in her mother; Arrhythmia in her mother; COPD in her mother; Cancer - Colon in her father; Hyperlipidemia in her sister; Osteoarthritis in her mother.  ROS:   Please see the history of present illness.   All other systems reviewed are negative.    Objective:    Vital Signs:  Ht 5\' 7"  (1.702 m)   Wt 153 lb (69.4 kg)   BMI 23.96 kg/m    Wt Readings from Last 3 Encounters:  09/08/20 153 lb (69.4 kg)  01/09/20 159 lb 6.4 oz (72.3 kg)  05/29/19 164 lb 6.4 oz (74.6 kg)    Alert female in no acute distress. She sounds good. Not short of breath with conversation.    Labs/Other Tests and Data Reviewed:    Lab Results  Component Value Date   WBC 7.9  01/09/2020   HGB 13.7 01/09/2020   HCT 39.4 01/09/2020   PLT 256 01/09/2020   GLUCOSE 83 01/09/2020   CHOL 136 01/09/2020   TRIG 100 01/09/2020   HDL 44 01/09/2020   LDLCALC 73 01/09/2020   ALT 19 01/09/2020   AST 16 01/09/2020   NA 140 01/09/2020   K 4.1 01/09/2020   CL 105 01/09/2020   CREATININE 0.75 01/09/2020   BUN 13 01/09/2020   CO2 19 (L) 01/09/2020   INR 1.04 01/05/2015   HGBA1C 5.6 01/05/2015     BNP (last 3 results) No results for input(s): BNP in the last 8760 hours.  ProBNP (last 3 results) No results for input(s): PROBNP in the last 8760 hours.    Prior CV studies:    The following studies were reviewed today:  Carotid Summary 04/2019: Right Carotid: The ECA appears >50% stenosed. The extracranial vessels were near-normal with only minimal wall thickening or plaque. No plaque evident throughout the ICA, minimum wall thickening is noted in the proximal segment.  Left Carotid: The ECA appears >50% stenosed. The extracranial vessels were near-normal with only minimal wall thickening or plaque. No plaque evident throughout the ICA.  Vertebrals: Bilateral vertebral arteries demonstrate antegrade flow. Small caliber left vertebral artery. Subclavians: Normal flow hemodynamics were seen in bilateral subclavian arteries.  *See table(s) above for measurements and observations.  Electronically signed by 05/2019 MD on 05/03/2019 at 4:29:34 PM.   EchoStudy Conclusions2016  - Left ventricle: Wall thickness was increased in a pattern of  moderate LVH. Systolic function was normal. The estimated  ejection fraction was in the range of 50% to 55%. Probable mild  hypokinesis of the inferior myocardium. Doppler parameters are  consistent with abnormal left ventricular relaxation (grade 1  diastolic dysfunction).  - Mitral valve: There  was mild regurgitation.     ASSESSMENT & PLAN:    1. CAD - prior inferior STEMi with DES x 2 to distal and prox RCA from 2016 - continues to do well without worrisome symptoms. She does have obstructive disease of the mLCX with prior attempt at staged PCI - resulted in dissection and attempt at re-crossing into the true lumen was not successful -  she has otherwise been managed medically. No change in current regimen.   2. HTN - prior BP ok - would follow for now - hope to see back in office for next visit - no changes made today.   3. HLD - on statin - last LDL was 73. No changes made - will need lab on return - she is aware.   4. Tobacco abuse - not ready to stop yet.   5. Carotid disease - her study from July of 2020 was very reassuring.   6. RA - this is her primary issue.   Patient Risk:   After full review of this patient's clinical status, I feel that they are at least moderate risk at this time.  Time:   Today, I have spent 6 minutes with the patient with telehealth technology discussing the above issues.     Medication Adjustments/Labs and Tests Ordered: Current medicines are reviewed at length with the patient today.  Concerns regarding medicines are outlined above.   Tests Ordered: No orders of the defined types were placed in this encounter.   Medication Changes: No orders of the defined types were placed in this encounter.   Disposition:  FU with Dr. Titus Mould, PA in 6 months - she will need fasting lab on return. No changes made today. She is aware that I am leaving in February.    Patient is agreeable to this plan and will call if any problems develop in the interim.   Avelina Laine, NP  09/08/2020 8:09 AM    [facility]

## 2020-08-26 ENCOUNTER — Ambulatory Visit: Payer: 59 | Admitting: Nurse Practitioner

## 2020-08-27 ENCOUNTER — Telehealth: Payer: Self-pay | Admitting: *Deleted

## 2020-08-27 NOTE — Telephone Encounter (Signed)
  Patient Consent for Virtual Visit         Tracina Beaumont has provided verbal consent on 08/27/2020 for a virtual visit (video or telephone).   CONSENT FOR VIRTUAL VISIT FOR:  Sara Trujillo  By participating in this virtual visit I agree to the following:  I hereby voluntarily request, consent and authorize [facility] and its employed or contracted physicians, Producer, television/film/video, nurse practitioners or other licensed health care professionals (the Practitioner), to provide me with telemedicine health care services (the "Services") as deemed necessary by the treating Practitioner. I acknowledge and consent to receive the Services by the Practitioner via telemedicine. I understand that the telemedicine visit will involve communicating with the Practitioner through live audiovisual communication technology and the disclosure of certain medical information by electronic transmission. I acknowledge that I have been given the opportunity to request an in-person assessment or other available alternative prior to the telemedicine visit and am voluntarily participating in the telemedicine visit.  I understand that I have the right to withhold or withdraw my consent to the use of telemedicine in the course of my care at any time, without affecting my right to future care or treatment, and that the Practitioner or I may terminate the telemedicine visit at any time. I understand that I have the right to inspect all information obtained and/or recorded in the course of the telemedicine visit and may receive copies of available information for a reasonable fee.  I understand that some of the potential risks of receiving the Services via telemedicine include:  Marland Kitchen Delay or interruption in medical evaluation due to technological equipment failure or disruption; . Information transmitted may not be sufficient (e.g. poor resolution of images) to allow for appropriate medical decision making by the  Practitioner; and/or  . In rare instances, security protocols could fail, causing a breach of personal health information.  Furthermore, I acknowledge that it is my responsibility to provide information about my medical history, conditions and care that is complete and accurate to the best of my ability. I acknowledge that Practitioner's advice, recommendations, and/or decision may be based on factors not within their control, such as incomplete or inaccurate data provided by me or distortions of diagnostic images or specimens that may result from electronic transmissions. I understand that the practice of medicine is not an exact science and that Practitioner makes no warranties or guarantees regarding treatment outcomes. I acknowledge that a copy of this consent can be made available to me via my patient portal [facility] MyChart), or I can request a printed copy by calling the office of [facility].    I understand that my insurance will be billed for this visit.   I have read or had this consent read to me. . I understand the contents of this consent, which adequately explains the benefits and risks of the Services being provided via telemedicine.  . I have been provided ample opportunity to ask questions regarding this consent and the Services and have had my questions answered to my satisfaction. . I give my informed consent for the services to be provided through the use of telemedicine in my medical care

## 2020-09-08 ENCOUNTER — Encounter: Payer: Self-pay | Admitting: Nurse Practitioner

## 2020-09-08 ENCOUNTER — Telehealth (INDEPENDENT_AMBULATORY_CARE_PROVIDER_SITE_OTHER): Payer: 59 | Admitting: Nurse Practitioner

## 2020-09-08 ENCOUNTER — Other Ambulatory Visit: Payer: Self-pay

## 2020-09-08 VITALS — Ht 67.0 in | Wt 153.0 lb

## 2020-09-08 DIAGNOSIS — I259 Chronic ischemic heart disease, unspecified: Secondary | ICD-10-CM | POA: Diagnosis not present

## 2020-09-08 DIAGNOSIS — I10 Essential (primary) hypertension: Secondary | ICD-10-CM | POA: Diagnosis not present

## 2020-09-08 DIAGNOSIS — E7800 Pure hypercholesterolemia, unspecified: Secondary | ICD-10-CM

## 2020-09-08 DIAGNOSIS — I6523 Occlusion and stenosis of bilateral carotid arteries: Secondary | ICD-10-CM | POA: Diagnosis not present

## 2020-09-08 DIAGNOSIS — Z720 Tobacco use: Secondary | ICD-10-CM

## 2020-09-08 NOTE — Patient Instructions (Addendum)
After Visit Summary:  We will be checking the following labs today - NONE  Needs fasting labs on next OV   Medication Instructions:    Continue with your current medicines.    If you need a refill on your cardiac medications before your next appointment, please call your pharmacy.     Testing/Procedures To Be Arranged:  N/A  Follow-Up:  See Tereso Newcomer, PA in 6 months with fasting labs.    At [facility], you and your health needs are our priority.  As part of our continuing mission to provide you with exceptional heart care, we have created designated Provider Care Teams.  These Care Teams include your primary Cardiologist (physician) and Advanced Practice Providers (APPs -  Physician Assistants and Nurse Practitioners) who all work together to provide you with the care you need, when you need it.  Special Instructions:  . Stay safe, wash your hands for at least 20 seconds and wear a mask when needed.  . It was good to talk with you today.    Call the [facility] office at [phone] if you have any questions, problems or concerns.

## 2020-10-27 ENCOUNTER — Other Ambulatory Visit: Payer: Self-pay | Admitting: Nurse Practitioner

## 2020-11-03 ENCOUNTER — Other Ambulatory Visit: Payer: Self-pay | Admitting: Nurse Practitioner

## 2020-11-06 ENCOUNTER — Other Ambulatory Visit: Payer: Self-pay | Admitting: Nurse Practitioner

## 2021-01-14 ENCOUNTER — Ambulatory Visit: Payer: 59 | Admitting: Physician Assistant

## 2021-01-15 ENCOUNTER — Other Ambulatory Visit: Payer: Self-pay

## 2021-01-15 MED ORDER — METOPROLOL TARTRATE 25 MG PO TABS: ORAL_TABLET | ORAL | 2 refills | Status: DC

## 2021-02-22 NOTE — Progress Notes (Addendum)
Cardiology Office Note:    Date:  02/23/2021   ID:  [name], DOB [birth year], MRN [id number]  PCP:  Gillian Scarce, MD   [facility] Providers Cardiologist:  Tonny Bollman, MD     Referring MD: Gillian Scarce, MD   Chief Complaint:  Follow-up (CAD)    Patient Profile:    Sara Trujillo is a 60 y.o. female with:   Coronary artery disease   Inferior STEMI 12/2014 s/p DES x 2 to dRCA and DES x 1 to p-mRCA  Residual LCx 90 and RI 80 >>Staged PCI of LCx unsuccessful (dissection)  pLAD 60 tx medically   Hypertension   Hyperlipidemia   GERD   Anxiety/depression   Bipolar d/o   ACE allergy (angioedema)  +Cigs   Rheumatoid arthritis   Prior CV studies: VAS US CAROTID DUPLEX BILATERAL 05/03/2019 Narrative Summary: Bilateral extracranial vessels were near-normal with only minimal wall thickening or plaque. No plaque evident throughout the ICA, minimum wall thickening is noted in the proximal segment.  Echocardiogram 01/06/15 - Left ventricle: Wall thickness was increased in a pattern of  moderate LVH. Systolic function was normal. The estimated  ejection fraction was in the range of 50% to 55%. Probable mild  hypokinesis of the inferior myocardium. Doppler parameters are  consistent with abnormal left ventricular relaxation (grade 1  diastolic dysfunction).  - Mitral valve: There was mild regurgitation.   Cardiac catheterization 01/04/15 1. Left main; normal  2. LAD; 60% proximal, first diagonal branch was small Caliber and moderate in length and had 50-60% stenosis in its proximal third 3. Left circumflex; nondominant with a 90% mid AV groove, there was a ramus branch that had an 80% segmental mid stenosis.  4. Right coronary artery; dominant with occlusion at the Genu . This was the infarct related artery 5. Left ventriculography; RAO left ventriculogram was performed using  25 mL of Visipaque dye at 12 mL/second. The overall LVEF  estimated  60 %  With wall motion abnormalities notable for a true apical severe hypokinesia PCI:  DES x 2 to dRCA and DES to prox-mid RCA  History of Present Illness: Sara Trujillo has been primarily seen by Norma Fredrickson, NP.  She was last seen via Telemedicine in 08/2020.  She is here alone.  She is doing well.  She has not had chest pain, shortness of breath, syncope, orthopnea, leg edema.        Past Medical History:  Diagnosis Date  . Anxiety   . Arthritis   . Bipolar disorder (HCC)   . CAD (coronary artery disease)    a. 12/2014 Inferior Inf STEMI/PCI: LM nl, LAD 60p, D1 50-60p, LCX nondom 47m, RI 80,m RCA dom, 100p (DES to prox and distal RCA), EF 60%;  b 12/2014 Staged attempted PCI of AV groove LCX with resultant dissection->aborted;  c. 12/2014 Echo: EF 50-55%, mod LVH, mild inf HK, Gr 1 DD, mild MR.  . Daily headache   . Depression   . GERD (gastroesophageal reflux disease)   . Hyperlipidemia   . Hypertension   . Tobacco abuse     Current Medications: Current Meds  Medication Sig  . amLODipine (NORVASC) 5 MG tablet TAKE 1 TABLET DAILY  . aspirin 81 MG tablet Take 1 tablet (81 mg total) by mouth daily.  Marland Kitchen atorvastatin (LIPITOR) 80 MG tablet TAKE 1 TABLET DAILY  . BRILINTA 60 MG TABS tablet TAKE 1 TABLET TWICE A DAY  . dextrose 5 % SOLN 50 mL  with methotrexate 25 MG/ML (PF) SOLN 40 mg/m2 Inject into the vein once a week. 1 ml weekly  . Dietary Management Product (RHEUMATE) CAPS Take 1 capsule by mouth daily.  . folic acid (FOLVITE) 1 MG tablet Take 1 mg by mouth 2 (two) times daily.   . hydroxychloroquine (PLAQUENIL) 200 MG tablet take one tablet twice a day with food or milk  . Melatonin 10 MG CAPS Take 10 mg by mouth at bedtime.  . metoprolol tartrate (LOPRESSOR) 25 MG tablet TAKE ONE-HALF (1/2) TABLET TWICE A DAY  . nitroGLYCERIN (NITROSTAT) 0.4 MG SL tablet Place 1 tablet (0.4 mg total) under the tongue every 5 (five) minutes as needed for chest pain.  . predniSONE  (DELTASONE) 5 MG tablet Take 2.5 mg by mouth 2 (two) times daily with a meal.  . traMADol (ULTRAM) 50 MG tablet take one tablet 4 times as needed for wrist pain/RA     Allergies:   Lisinopril, Penicillins, and Sulfa antibiotics   Social History   Tobacco Use  . Smoking status: Current Every Day Smoker    Packs/day: 0.75    Years: 29.00    Pack years: 21.75    Types: Cigarettes  . Smokeless tobacco: Never Used  Vaping Use  . Vaping Use: Never used  Substance Use Topics  . Alcohol use: Yes    Alcohol/week: 0.0 standard drinks    Comment: 01/07/2015 "might have a drink a few times/yr"  . Drug use: No     Family Hx: The patient's family history includes Alzheimer's disease in her mother; Arrhythmia in her mother; COPD in her mother; Cancer - Colon in her father; Hyperlipidemia in her sister; Osteoarthritis in her mother.  ROS -see HPI  EKGs/Labs/Other Test Reviewed:    EKG:  EKG is   ordered today.  The ekg ordered today demonstrates NSR, HR 70, normal axis, J-point elevation, QTC 438, no change when compared to prior tracing  Recent Labs: No results found for requested labs within last 8760 hours.   Recent Lipid Panel Lab Results  Component Value Date/Time   CHOL 144 02/23/2021 09:48 AM   TRIG 99 02/23/2021 09:48 AM   HDL 46 02/23/2021 09:48 AM   CHOLHDL 3.1 02/23/2021 09:48 AM   CHOLHDL 4.3 06/14/2016 10:22 AM   LDLCALC 80 02/23/2021 09:48 AM   Labs obtained through [facility] Tool - personally reviewed and interpreted: 02/09/21: Hgb 14, creatinine 0.71, ALT 24   Risk Assessment/Calculations:      Physical Exam:    VS:  BP (!) 150/80   Pulse 70   Ht 5\' 7"  (1.702 m)   Wt 154 lb 3.2 oz (69.9 kg)   SpO2 98%   BMI 24.15 kg/m     Wt Readings from Last 3 Encounters:  02/23/21 154 lb 3.2 oz (69.9 kg)  09/08/20 153 lb (69.4 kg)  01/09/20 159 lb 6.4 oz (72.3 kg)     Constitutional:      Appearance: Healthy appearance. Not in distress.  Neck:     Vascular: JVD  normal.  Pulmonary:     Effort: Pulmonary effort is normal.     Breath sounds: No wheezing. No rales.  Cardiovascular:     Normal rate. Regular rhythm. Normal S1. Normal S2.     Murmurs: There is no murmur.  Edema:    Peripheral edema absent.  Abdominal:     Palpations: Abdomen is soft. There is no hepatomegaly.  Skin:    General: Skin is warm and  dry.  Neurological:     Mental Status: Alert and oriented to person, place and time.     Cranial Nerves: Cranial nerves are intact.          ASSESSMENT & PLAN:    1. Coronary artery disease involving native coronary artery of native heart without angina pectoris Status post inferior STEMI in 3/16 treated with a total of 3 DES to the RCA.  She had attempted PCI to the residual disease in the LCx.  However, this was unsuccessful.  She has moderate nonobstructive disease in LAD.  She has been managed medically.  She is doing well without anginal symptoms.  Continue aspirin, ticagrelor, amlodipine, metoprolol, atorvastatin.  Follow-up in 6 months.  2. Essential hypertension Blood pressure above target.  I have asked her to monitor blood pressure the next couple weeks and send those readings for review.  Goal blood pressure <130/80.  If blood pressure remains above goal, I will increase her amlodipine.  3. Pure hypercholesterolemia Continue high intensity statin therapy.  Obtain fasting lipids today.  4. Tobacco abuse We discussed the importance of quitting smoking.   Dispo:  Return in about 6 months (around 08/26/2021) for Routine Follow Up, w/ Tereso Newcomer, PA-C.   Medication Adjustments/Labs and Tests Ordered: Current medicines are reviewed at length with the patient today.  Concerns regarding medicines are outlined above.  Tests Ordered: Orders Placed This Encounter  Procedures  . Lipid panel  . EKG 12-Lead   Medication Changes: No orders of the defined types were placed in this encounter.   Signed, Tereso Newcomer, PA-C   02/23/2021 4:49 PM    [facility] [address] [place], [place], [place]  [address] Phone: [phone]; Fax: [phone]

## 2021-02-23 ENCOUNTER — Ambulatory Visit (INDEPENDENT_AMBULATORY_CARE_PROVIDER_SITE_OTHER): Payer: 59 | Admitting: Physician Assistant

## 2021-02-23 ENCOUNTER — Encounter: Payer: Self-pay | Admitting: Physician Assistant

## 2021-02-23 ENCOUNTER — Other Ambulatory Visit: Payer: Self-pay

## 2021-02-23 VITALS — BP 150/80 | HR 70 | Ht 67.0 in | Wt 154.2 lb

## 2021-02-23 DIAGNOSIS — Z720 Tobacco use: Secondary | ICD-10-CM

## 2021-02-23 DIAGNOSIS — E7800 Pure hypercholesterolemia, unspecified: Secondary | ICD-10-CM | POA: Diagnosis not present

## 2021-02-23 DIAGNOSIS — I2510 Atherosclerotic heart disease of native coronary artery without angina pectoris: Secondary | ICD-10-CM | POA: Diagnosis not present

## 2021-02-23 DIAGNOSIS — I10 Essential (primary) hypertension: Secondary | ICD-10-CM

## 2021-02-23 LAB — LIPID PANEL
Chol/HDL Ratio: 3.1 ratio (ref 0.0–4.4)
Cholesterol, Total: 144 mg/dL (ref 100–199)
HDL: 46 mg/dL (ref >39)
LDL Chol Calc (NIH): 80 mg/dL (ref 0–99)
Triglycerides: 99 mg/dL (ref 0–149)
VLDL Cholesterol Cal: 18 mg/dL (ref 5–40)

## 2021-02-23 NOTE — Patient Instructions (Signed)
Medication Instructions:  Your physician recommends that you continue on your current medications as directed. Please refer to the Current Medication list given to you today.  *If you need a refill on your cardiac medications before your next appointment, please call your pharmacy*   Lab Work: TODAY: LIPIDS  If you have labs (blood work) drawn today and your tests are completely normal, you will receive your results only by: Marland Kitchen MyChart Message (if you have MyChart) OR . A paper copy in the mail If you have any lab test that is abnormal or we need to change your treatment, we will call you to review the results.   Testing/Procedures: None   Follow-Up: At [facility], you and your health needs are our priority.  As part of our continuing mission to provide you with exceptional heart care, we have created designated Provider Care Teams.  These Care Teams include your primary Cardiologist (physician) and Advanced Practice Providers (APPs -  Physician Assistants and Nurse Practitioners) who all work together to provide you with the care you need, when you need it.  We recommend signing up for the patient portal called "MyChart".  Sign up information is provided on this After Visit Summary.  MyChart is used to connect with patients for Virtual Visits (Telemedicine).  Patients are able to view lab/test results, encounter notes, upcoming appointments, etc.  Non-urgent messages can be sent to your provider as well.   To learn more about what you can do with MyChart, go to [email or web address].    Your next appointment:   6 month(s)  The format for your next appointment:   In Person  Provider:   Tereso Newcomer, PA-C   Other Instructions None

## 2021-02-25 ENCOUNTER — Other Ambulatory Visit: Payer: Self-pay | Admitting: *Deleted

## 2021-02-25 DIAGNOSIS — Z79899 Other long term (current) drug therapy: Secondary | ICD-10-CM

## 2021-02-25 DIAGNOSIS — I2510 Atherosclerotic heart disease of native coronary artery without angina pectoris: Secondary | ICD-10-CM

## 2021-02-25 DIAGNOSIS — E7800 Pure hypercholesterolemia, unspecified: Secondary | ICD-10-CM

## 2021-02-25 MED ORDER — EZETIMIBE 10 MG PO TABS: 10.0000 mg | ORAL_TABLET | Freq: Every day | ORAL | 3 refills | Status: DC

## 2021-07-30 ENCOUNTER — Other Ambulatory Visit: Payer: Self-pay

## 2021-07-30 MED ORDER — TICAGRELOR 60 MG PO TABS: 60.0000 mg | ORAL_TABLET | Freq: Two times a day (BID) | ORAL | 2 refills | Status: DC

## 2021-10-12 ENCOUNTER — Other Ambulatory Visit: Payer: Self-pay | Admitting: Cardiovascular Disease

## 2021-10-13 ENCOUNTER — Other Ambulatory Visit: Payer: Self-pay

## 2021-10-22 ENCOUNTER — Other Ambulatory Visit: Payer: Self-pay

## 2021-10-22 MED ORDER — ATORVASTATIN CALCIUM 80 MG PO TABS: 80.0000 mg | ORAL_TABLET | Freq: Every day | ORAL | 1 refills | Status: DC

## 2021-11-04 ENCOUNTER — Other Ambulatory Visit: Payer: Self-pay | Admitting: Cardiovascular Disease

## 2022-02-02 ENCOUNTER — Other Ambulatory Visit: Payer: Self-pay | Admitting: Physician Assistant

## 2022-04-12 ENCOUNTER — Other Ambulatory Visit: Payer: Self-pay | Admitting: Cardiovascular Disease

## 2022-04-20 ENCOUNTER — Other Ambulatory Visit: Payer: Self-pay | Admitting: Cardiovascular Disease

## 2022-04-26 ENCOUNTER — Other Ambulatory Visit: Payer: Self-pay | Admitting: Cardiovascular Disease

## 2022-05-03 ENCOUNTER — Other Ambulatory Visit: Payer: Self-pay | Admitting: Cardiovascular Disease

## 2022-09-06 NOTE — Progress Notes (Unsigned)
Cardiology Clinic Note   Patient Name: Sara MccreedyMelinda Trujillo Date of Encounter: 09/07/2022  Primary Care Provider:  Gillian ScarceZanard, Robyn K, MD (Inactive) Primary Cardiologist:  Tonny BollmanMichael Cooper, MD  Patient Profile    Ms. Sara Trujillo is a 61 year-old female with a past medical history of CAD s/p DES x 2 distal RCA and DES x 1 to proximal to mid RCA March 2016, hypertension, hyperlipidemia, bilateral carotid stenosis, tobacco abuse, GERD, angioedema reaction to ACEi, and RA who presents to the clinic today for follow-up of chronic cardiac conditions.   Past Medical History    Past Medical History:  Diagnosis Date   Anxiety    Arthritis    Bipolar disorder (HCC)    CAD (coronary artery disease)    a. 12/2014 Inferior Inf STEMI/PCI: LM nl, LAD 60p, D1 50-60p, LCX nondom 8030m, RI 80,m RCA dom, 100p (DES to prox and distal RCA), EF 60%;  b 12/2014 Staged attempted PCI of AV groove LCX with resultant dissection->aborted;  c. 12/2014 Echo: EF 50-55%, mod LVH, mild inf HK, Gr 1 DD, mild MR.   Daily headache    Depression    GERD (gastroesophageal reflux disease)    Hyperlipidemia    Hypertension    Tobacco abuse    Past Surgical History:  Procedure Laterality Date   LAPAROSCOPIC CHOLECYSTECTOMY  1990's   LEFT HEART CATHETERIZATION WITH CORONARY ANGIOGRAM N/A 01/04/2015   Procedure: LEFT HEART CATHETERIZATION WITH CORONARY ANGIOGRAM;  Surgeon: Runell GessJonathan J Berry, MD; LAD 60%, D1 60%, CFX 90%, RI 80%, RCA 100%>>0% w/  2.25 x 12 mm Xience DES, EF 60%     PERCUTANEOUS CORONARY STENT INTERVENTION (PCI-S)  01/04/2015   Procedure: PERCUTANEOUS CORONARY STENT INTERVENTION (PCI-S);  Surgeon: Runell GessJonathan J Berry, MD;  2.25 x 12 mm Xience DES to RCA     PERCUTANEOUS CORONARY STENT INTERVENTION (PCI-S) N/A 01/06/2015   Procedure: PERCUTANEOUS CORONARY STENT INTERVENTION (PCI-S);  Surgeon: Runell GessJonathan J Berry, MD; RCA OK, CFX unsuccessfult PCI 2nd distal dissection     Allergies  Allergies  Allergen Reactions    Lisinopril Swelling   Penicillins Hives   Sulfa Antibiotics Other (See Comments)    Parents both allergic and was told not to take sulfa drugs    History of Present Illness    Ms. Pytel has a past medical history of: CAD Inferior STEMI March 2016: DES x 2 to distal RCA and DES x 1 to proximal to mid RCA.  Residual LCx 90% and RI 80 - staged PCI to LCx unsuccessful secondary to dissection.  Proximal LAD 60% - treated medically.  Echo 3/21/206: EF 50-55%. Moderate LVH. Grade I DD. Mild mitral valve regurgitation.  Hypertension.  Hyperlipidemia. Lipid panel 02/23/2021: LDL 80, HDL 46, TG 99, total cholesterol 144.   Bilateral carotid stenosis.  Carotid duplex US 01/12/2016: Heterogeneous plaque bilaterally. 1-39% ICA stenosis bilaterally. >50% bilateral ECA stenosis bilaterally.  Carotid duplex US 02/06/2018: Right ICA 1-39%, left ICA 40-59%. Left ECA >50%. Carotid duplex US 05/03/2019: Bilateral ECA >50%. No plaque evident through ICA bilaterally.  Tobacco abuse.  GERD. RA.   Ms. Sara Trujillo is a patient of Dr. Excell Seltzerooper since 2016 when she was hospitalized for STEMI with subsequent PCI as above. She was last seen in the office on 02/23/2021 by Tereso NewcomerScott Weaver, PA for routine follow-up. Her BP was elevated and she was instructed to send in a two week log to see if amlodipine needed to be increased.   Today, patient is hypertensive in the office today  at 180/100 at intake and 172/100 at recheck. She has been out of all her medications except Brilinta for one week. She does not typically check her BP at home but feels it is normally in the 130s/80s range. 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 No dizziness. She otherwise is doing well. Patient denies shortness of breath or dyspnea on exertion. No chest pain, pressure, or tightness. Denies lower extremity edema, orthopnea, or PND. No  palpitations. She continues to smoke a pack a day. She is interested in quitting but is concerned about her anxiety and moodiness for the first two weeks of quitting.    Home Medications    Current Meds  Medication Sig   aspirin 81 MG tablet Take 1 tablet (81 mg total) by mouth daily.   Melatonin 10 MG CAPS Take 10 mg by mouth at bedtime.   metoprolol succinate (TOPROL-XL) 50 MG 24 hr tablet Take 1 tablet (50 mg total) by mouth daily. Take with or immediately following a meal.   nitroGLYCERIN (NITROSTAT) 0.4 MG SL tablet Place 1 tablet (0.4 mg total) under the tongue every 5 (five) minutes as needed for chest pain.   ticagrelor (BRILINTA) 60 MG TABS tablet Take 1 tablet (60 mg total) by mouth 2 (two) times daily. CALL AND SCHEDULE FOLLOW UP OFFICE VISIT TO GET FURTHER REFILLS. [phone].   [DISCONTINUED] amLODipine (NORVASC) 5 MG tablet Take 1 tablet (5 mg total) by mouth daily. CALL AND SCHEDULE FOLLOW UP OFFICE VISIT TO RECEIVE FURTHER REFILLS. [phone]. 1ST ATTEMPT.   [DISCONTINUED] atorvastatin (LIPITOR) 80 MG tablet Take 1 tablet (80 mg total) by mouth daily. Call and schedule follow up office visit to receive further refills. [phone]. 1st attempt.   [DISCONTINUED] Dietary Management Product (RHEUMATE) CAPS Take 1 capsule by mouth daily.   [DISCONTINUED] ezetimibe (ZETIA) 10 MG tablet TAKE 1 TABLET DAILY   [DISCONTINUED] metoprolol tartrate (LOPRESSOR) 25 MG tablet TAKE ONE-HALF (1/2) TABLET TWICE A DAY. Please schedule an appt. With Cardiologist in order to receive future refills. Thank You. 1st Attempt.    Family History    Family History  Problem Relation Age of Onset   COPD Mother    Arrhythmia Mother    Alzheimer's disease Mother    Osteoarthritis Mother    Cancer - Colon Father    Hyperlipidemia Sister    She indicated that her mother is deceased. She indicated that her father is deceased. She indicated that the status of her sister is unknown. She indicated that  her maternal grandmother is deceased. She indicated that her maternal grandfather is deceased. She indicated that her paternal grandmother is deceased. She indicated that her paternal grandfather is deceased.   Social History    Social History   Socioeconomic History   Marital status: Married    Spouse name: Not on file   Number of children: Not on file   Years of education: Not on file   Highest education level: Not on file  Occupational History   Not on file  Tobacco Use   Smoking status: Every Day    Packs/day: 0.75    Years: 29.00    Total pack years: 21.75    Types: Cigarettes   Smokeless tobacco: Never  Vaping Use   Vaping Use: Never used  Substance and Sexual Activity   Alcohol use: Yes    Alcohol/week: 0.0 standard drinks of alcohol  Comment: 01/07/2015 "might have a drink a few times/yr"   Drug use: No   Sexual activity: Never  Other Topics Concern   Not on file  Social History Narrative   Not on file   Social Determinants of Health   Financial Resource Strain: Not on file  Food Insecurity: Not on file  Transportation Needs: Not on file  Physical Activity: Not on file  Stress: Not on file  Social Connections: Not on file  Intimate Partner Violence: Not on file     Review of Systems    General: No chills, fever, night sweats or weight changes.  Cardiovascular:  No chest pain, dyspnea on exertion, edema, orthopnea, palpitations, paroxysmal nocturnal dyspnea. Dermatological: No rash, lesions/masses Respiratory: No cough, dyspnea Urologic: No hematuria, dysuria Abdominal:   No nausea, vomiting, diarrhea, bright red blood per rectum, melena, or hematemesis Neurologic:  No visual changes, weakness, changes in mental status. All other systems reviewed and are otherwise negative except as noted above.  Physical Exam    VS:  BP (!) 180/100   Pulse 93   Ht 5\' 7"  (1.702 m)   Wt 150 lb 12.8 oz (68.4 kg)   SpO2 98%   BMI 23.62 kg/m  , BMI Body mass index  is 23.62 kg/m. GEN:  Well nourished, well developed, in no acute distress. HEENT: Normal. Neck: Supple, no JVD, carotid bruits, or masses. Cardiac: RRR, no murmurs, rubs, or gallops. No clubbing, cyanosis, edema.  Radials/DP/PT 2+ and equal bilaterally.  Respiratory:  Respirations regular and unlabored, clear to auscultation bilaterally. GI: Soft, nontender, nondistended. MS: No deformity or atrophy. Skin: Warm and dry, no rash. Neuro: Strength and sensation are intact. Psych: Normal affect.  Accessory Clinical Findings     Recent Labs: No results found for requested labs within last 365 days.   Recent Lipid Panel    Component Value Date/Time   CHOL 144 02/23/2021 0948   TRIG 99 02/23/2021 0948   HDL 46 02/23/2021 0948   CHOLHDL 3.1 02/23/2021 0948   CHOLHDL 4.3 06/14/2016 1022   VLDL 34 (H) 06/14/2016 1022   LDLCALC 80 02/23/2021 0948    HYPERTENSION CONTROL Vitals:   09/07/22 0758 09/07/22 0833  BP: (!) 180/100 (!) 172/100    The patient's blood pressure is elevated above target today.  In order to address the patient's elevated BP: Blood pressure will be monitored at home to determine if medication changes need to be made.; A current anti-hypertensive medication was adjusted today.       ECG personally reviewed by me today: NSR, HR 93  No significant changes from 02/23/2021.      Assessment & Plan   CAD. S/p PCI DES x 2 distal RCA, DES x 1 proximal to mid RCA, residual disease LCx 90% (unsuccessful PCI - dissection) RI 80% and proximal LAD 60%. Patient denies chest pain, shortness of breath or DOE. Continue aspirin, Brilinta, metoprolol, atorvastatin, zetia, and prn nitro (she has not needed this).  Hypertension. BP 180/100 at intake and 172/100 at recheck. Patient has been out of medications for one week. She does not check her BP at home regularly but it feels it is in the 130s/80s range when she does check it. Will restart amlodipine 5 mg and change her  metoprolol tartrate to Toprol XL 50 mg daily. Patient will call in a 2 week BP log if BP consistently >130/80. May consider adding chlorthalidone 12.5 mg if BP staying over 130/80. Hyperlipidemia. Lipid panel 02/23/2021 LDL 80,  not at goal. Patient is fasting today. Will get lipid panel and LFTs today. Continue atorvastatin and zetia.  Tobacco abuse. Patient continues to smoke a pack per day. She is interested in quitting but is concerned about anxiety and moodiness particularly in the first two weeks of quitting. She would like something for her "nerves" for a couple of weeks. She is encouraged to speak with PCP regarding this. Will provide smoking cessation resources to patient.       Disposition: CMP and lipid panel today. Call in 2 week BP log if >130/80. Stop metoprolol tartrate and begin Toprol XL 50 mg daily. Refill all cardiac medications. Return in two months or sooner as needed.    Etta Grandchild. Anice Wilshire, NP-C     09/07/2022, 8:32 AM [facility] [address] Office [phone] Fax [phone]   I spent 13 minutes examining this patient, reviewing medications, and using patient centered shared decision making involving her cardiac care.  Prior to her visit I spent greater than 20 minutes reviewing her past medical history,  medications, and prior cardiac tests.

## 2022-09-07 ENCOUNTER — Ambulatory Visit: Payer: 59 | Attending: Physician Assistant | Admitting: Student

## 2022-09-07 ENCOUNTER — Encounter: Payer: Self-pay | Admitting: Student

## 2022-09-07 VITALS — BP 172/100 | HR 93 | Ht 67.0 in | Wt 150.8 lb

## 2022-09-07 DIAGNOSIS — I2510 Atherosclerotic heart disease of native coronary artery without angina pectoris: Secondary | ICD-10-CM | POA: Diagnosis not present

## 2022-09-07 DIAGNOSIS — E7800 Pure hypercholesterolemia, unspecified: Secondary | ICD-10-CM | POA: Diagnosis not present

## 2022-09-07 DIAGNOSIS — I10 Essential (primary) hypertension: Secondary | ICD-10-CM

## 2022-09-07 LAB — LIPID PANEL
Chol/HDL Ratio: 6 ratio — ABNORMAL HIGH (ref 0.0–4.4)
Cholesterol, Total: 268 mg/dL — ABNORMAL HIGH (ref 100–199)
HDL: 45 mg/dL (ref >39)
LDL Chol Calc (NIH): 184 mg/dL — ABNORMAL HIGH (ref 0–99)
Triglycerides: 206 mg/dL — ABNORMAL HIGH (ref 0–149)
VLDL Cholesterol Cal: 39 mg/dL (ref 5–40)

## 2022-09-07 LAB — COMPREHENSIVE METABOLIC PANEL
ALT: 13 IU/L (ref 0–32)
AST: 13 IU/L (ref 0–40)
Albumin/Globulin Ratio: 1.8 (ref 1.2–2.2)
Albumin: 4.4 g/dL (ref 3.9–4.9)
Alkaline Phosphatase: 122 IU/L — ABNORMAL HIGH (ref 44–121)
BUN/Creatinine Ratio: 16 (ref 12–28)
BUN: 11 mg/dL (ref 8–27)
Bilirubin Total: 0.4 mg/dL (ref 0.0–1.2)
CO2: 21 mmol/L (ref 20–29)
Calcium: 9.5 mg/dL (ref 8.7–10.3)
Chloride: 106 mmol/L (ref 96–106)
Creatinine, Ser: 0.7 mg/dL (ref 0.57–1.00)
Globulin, Total: 2.5 g/dL (ref 1.5–4.5)
Glucose: 95 mg/dL (ref 70–99)
Potassium: 4 mmol/L (ref 3.5–5.2)
Sodium: 140 mmol/L (ref 134–144)
Total Protein: 6.9 g/dL (ref 6.0–8.5)
eGFR: 98 mL/min/{1.73_m2} (ref >59)

## 2022-09-07 MED ORDER — METOPROLOL SUCCINATE ER 50 MG PO TB24: 50.0000 mg | ORAL_TABLET | Freq: Every day | ORAL | 3 refills | Status: DC

## 2022-09-07 MED ORDER — ATORVASTATIN CALCIUM 80 MG PO TABS: 80.0000 mg | ORAL_TABLET | Freq: Every day | ORAL | 3 refills | Status: DC

## 2022-09-07 MED ORDER — EZETIMIBE 10 MG PO TABS: 10.0000 mg | ORAL_TABLET | Freq: Every day | ORAL | 3 refills | Status: DC

## 2022-09-07 MED ORDER — AMLODIPINE BESYLATE 5 MG PO TABS: 5.0000 mg | ORAL_TABLET | Freq: Every day | ORAL | 3 refills | Status: DC

## 2022-09-07 NOTE — Patient Instructions (Signed)
Medication Instructions:  Your physician has recommended you make the following change in your medication:   STOP Metoprolol Tartrate  START Metoprolol Succinate 50 mg taking 1 daily   *If you need a refill on your cardiac medications before your next appointment, please call your pharmacy*   Lab Work: TODAY:  LIPID & CMET  If you have labs (blood work) drawn today and your tests are completely normal, you will receive your results only by: MyChart Message (if you have MyChart) OR A paper copy in the mail If you have any lab test that is abnormal or we need to change your treatment, we will call you to review the results.   Testing/Procedures: None ordered   Follow-Up: At [facility], you and your health needs are our priority.  As part of our continuing mission to provide you with exceptional heart care, we have created designated Provider Care Teams.  These Care Teams include your primary Cardiologist (physician) and Advanced Practice Providers (APPs -  Physician Assistants and Nurse Practitioners) who all work together to provide you with the care you need, when you need it.  We recommend signing up for the patient portal called "MyChart".  Sign up information is provided on this After Visit Summary.  MyChart is used to connect with patients for Virtual Visits (Telemedicine).  Patients are able to view lab/test results, encounter notes, upcoming appointments, etc.  Non-urgent messages can be sent to your provider as well.   To learn more about what you can do with MyChart, go to [email or web address].    Your next appointment:   2 month(s)  The format for your next appointment:   In Person  Provider:   Tonny Bollman, MD  or Tereso Newcomer, PA-C         Other Instructions Your physician has requested that you regularly monitor and record your blood pressure readings at home. Please use the same machine at the same time of day to check your readings and record them to  bring to your follow-up visit.   Please monitor blood pressures and keep a log of your readings for 2 weeks and if it's consistently running higher than 130/80, please call / send a mychart message.    Make sure to check 2 hours after your medications.    AVOID these things for 30 minutes before checking your blood pressure: No Drinking caffeine. No Drinking alcohol. No Eating. No Smoking. No Exercising.   Five minutes before checking your blood pressure: Pee. Sit in a dining chair. Avoid sitting in a soft couch or armchair. Be quiet. Do not talk   Blood pressure log Date: _______________________ a.m. _____________________(1st reading) _____________________(2nd reading) p.m. _____________________(1st reading) _____________________(2nd reading) Date: _______________________ a.m. _____________________(1st reading) _____________________(2nd reading) p.m. _____________________(1st reading) _____________________(2nd reading) Date: _______________________ a.m. _____________________(1st reading) _____________________(2nd reading) p.m. _____________________(1st reading) _____________________(2nd reading) Date: _______________________ a.m. _____________________(1st reading) _____________________(2nd reading) p.m. _____________________(1st reading) _____________________(2nd reading) Date: _______________________ a.m. _____________________(1st reading) _____________________(2nd reading) p.m. _____________________(1st reading) _____________________(2nd reading) This information is not intended to replace advice given to you by your health care provider. Make sure you discuss any questions you have with your health care provider. Document Revised: 06/18/2021 Document Reviewed: 06/18/2021 Elsevier Patient Education  2023 Elsevier Inc.    Steps to Quit Smoking Smoking tobacco is the leading cause of preventable death. It can affect almost every organ in the body. Smoking puts you and  people around you at risk for many serious, long-lasting (chronic) diseases. Quitting smoking can be  hard, but it is one of the best things that you can do for your health. It is never too late to quit. Do not give up if you cannot quit the first time. Some people need to try many times to quit. Do your best to stick to your quit plan, and talk with your doctor if you have any questions or concerns. How do I get ready to quit? Pick a date to quit. Set a date within the next 2 weeks to give you time to prepare. Write down the reasons why you are quitting. Keep this list in places where you will see it often. Tell your family, friends, and co-workers that you are quitting. Their support is important. Talk with your doctor about the choices that may help you quit. Find out if your health insurance will pay for these treatments. Know the people, places, things, and activities that make you want to smoke (triggers). Avoid them. What first steps can I take to quit smoking? Throw away all cigarettes at home, at work, and in your car. Throw away the things that you use when you smoke, such as ashtrays and lighters. Clean your car. Empty the ashtray. Clean your home, including curtains and carpets. What can I do to help me quit smoking? Talk with your doctor about taking medicines and seeing a counselor. You are more likely to succeed when you do both. If you are pregnant or breastfeeding: Talk with your doctor about counseling or other ways to quit smoking. Do not take medicine to help you quit smoking unless your doctor tells you to. Quit right away Quit smoking completely, instead of slowly cutting back on how much you smoke over a period of time. Stopping smoking right away may be more successful than slowly quitting. Go to counseling. In-person is best if this is an option. You are more likely to quit if you go to counseling sessions regularly. Take medicine You may take medicines to help you  quit. Some medicines need a prescription, and some you can buy over-the-counter. Some medicines may contain a drug called nicotine to replace the nicotine in cigarettes. Medicines may: Help you stop having the desire to smoke (cravings). Help to stop the problems that come when you stop smoking (withdrawal symptoms). Your doctor may ask you to use: Nicotine patches, gum, or lozenges. Nicotine inhalers or sprays. Non-nicotine medicine that you take by mouth. Find resources Find resources and other ways to help you quit smoking and remain smoke-free after you quit. They include: Online chats with a Veterinary surgeon. Phone quitlines. Printed Materials engineer. Support groups or group counseling. Text messaging programs. Mobile phone apps. Use apps on your mobile phone or tablet that can help you stick to your quit plan. Examples of free services include Quit Guide from the CDC and smokefree.gov  What can I do to make it easier to quit?  Talk to your family and friends. Ask them to support and encourage you. Call a phone quitline, such as 1-800-QUIT-NOW, reach out to support groups, or work with a Veterinary surgeon. Ask people who smoke to not smoke around you. Avoid places that make you want to smoke, such as: Bars. Parties. Smoke-break areas at work. Spend time with people who do not smoke. Lower the stress in your life. Stress can make you want to smoke. Try these things to lower stress: Getting regular exercise. Doing deep-breathing exercises. Doing yoga. Meditating. What benefits will I see if I quit smoking? Over time, you may have:  A better sense of smell and taste. Less coughing and sore throat. A slower heart rate. Lower blood pressure. Clearer skin. Better breathing. Fewer sick days. Summary Quitting smoking can be hard, but it is one of the best things that you can do for your health. Do not give up if you cannot quit the first time. Some people need to try many times to  quit. When you decide to quit smoking, make a plan to help you succeed. Quit smoking right away, not slowly over a period of time. When you start quitting, get help and support to keep you smoke-free. This information is not intended to replace advice given to you by your health care provider. Make sure you discuss any questions you have with your health care provider. Document Revised: 09/25/2021 Document Reviewed: 09/25/2021 Elsevier Patient Education  2023 Elsevier Inc.  Important Information About Sugar

## 2022-09-13 DIAGNOSIS — I10 Essential (primary) hypertension: Secondary | ICD-10-CM

## 2022-09-15 MED ORDER — AMLODIPINE BESYLATE 10 MG PO TABS: 10.0000 mg | ORAL_TABLET | Freq: Every day | ORAL | 3 refills | Status: DC

## 2022-09-15 MED ORDER — CARVEDILOL 12.5 MG PO TABS: 12.5000 mg | ORAL_TABLET | Freq: Two times a day (BID) | ORAL | 3 refills | Status: DC

## 2022-09-28 ENCOUNTER — Ambulatory Visit: Payer: 59 | Attending: Cardiovascular Disease | Admitting: Pharmacist

## 2022-09-28 VITALS — BP 120/74 | HR 63

## 2022-09-28 DIAGNOSIS — I10 Essential (primary) hypertension: Secondary | ICD-10-CM

## 2022-09-28 NOTE — Patient Instructions (Signed)
Summary of today's discussion  1.Continue amlodipine 10mg  daily and carvedilol 12.5mg  twice a day  2.Continue checking blood pressure 1-2 times per day. Make sure you are resting 5 min before checking  3.Please bring your blood pressure cuff with you to your next appointment along with your readings  4.  5.   Your blood pressure goal is <130/80  To check your pressure at home you will need to:  1. Sit up in a chair, with feet flat on the floor and back supported. Do not cross your ankles or legs. 2. Rest your left arm so that the cuff is about heart level. If the cuff goes on your upper arm,  then just relax the arm on the table, arm of the chair or your lap. If you have a wrist cuff, we  suggest relaxing your wrist against your chest (think of it as Pledging the Flag with the  wrong arm).  3. Place the cuff snugly around your arm, about 1 inch above the crook of your elbow. The  cords should be inside the groove of your elbow.  4. Sit quietly, with the cuff in place, for about 5 minutes. After that 5 minutes press the power  button to start a reading. 5. Do not talk or move while the reading is taking place.  6. Record your readings on a sheet of paper. Although most cuffs have a memory, it is often  easier to see a pattern developing when the numbers are all in front of you.  7. You can repeat the reading after 1-3 minutes if it is recommended  Make sure your bladder is empty and you have not had caffeine or tobacco within the last 30 min  Always bring your blood pressure log with you to your appointments. If you have not brought your monitor in to be double checked for accuracy, please bring it to your next appointment.  You can find a list of validated (accurate) blood pressure cuffs at   Important lifestyle changes to control high blood pressure  Intervention  Effect on the BP  Lose extra pounds and watch your waistline Weight loss is one of the most effective  lifestyle changes for controlling blood pressure. If you're overweight or obese, losing even a small amount of weight can help reduce blood pressure. Blood pressure might go down by about 1 millimeter of mercury (mm Hg) with each kilogram (about 2.2 pounds) of weight lost.  Exercise regularly As a general goal, aim for at least 30 minutes of moderate physical activity every day. Regular physical activity can lower high blood pressure by about 5 to 8 mm Hg.  Eat a healthy diet Eating a diet rich in whole grains, fruits, vegetables, and low-fat dairy products and low in saturated fat and cholesterol. A healthy diet can lower high blood pressure by up to 11 mm Hg.  Reduce salt (sodium) in your diet Even a small reduction of sodium in the diet can improve heart health and reduce high blood pressure by about 5 to 6 mm Hg.  Limit alcohol One drink equals 12 ounces of beer, 5 ounces of wine, or 1.5 ounces of 80-proof liquor.  Limiting alcohol to less than one drink a day for women or two drinks a day for men can help lower blood pressure by about 4 mm Hg.   Please call me at [phone] with any questions.

## 2022-09-28 NOTE — Progress Notes (Signed)
Patient ID: Demita Tobia                 DOB: 1961/04/28                      MRN: [id number]      HPI: Sara Trujillo is a 61 y.o. female patient of Dr. Excell Seltzer referred by Carlos Levering, NP to HTN clinic. PMH is significant for CAD s/p DES x 2 distal RCA and DES x 1 to proximal to mid RCA March 2016, hypertension, hyperlipidemia, bilateral carotid stenosis, tobacco abuse, GERD, angioedema reaction to ACEi, and RA.     Blood pressure in clinic 11/21 was 172/100. Patient had been out of her medication for 1 week. Amlodipine 5mg  daily and metoprolol XL 50mg  was restarted. Patient contacted the clinic about a week later reporting continued elevated blood pressures. Amlodipine was increased to 10mg  daily and metoprolol was changed to carvedilol 12.5mg  BID.  Patient presents today to HTN clinic. She reports that she has quit smoking and significantly decreased her caffeine intake by about half. Cooking more at home. Watching her sodium. She does not exercise. Brings in blood pressure log. Significant improvement seen. She does have some readings in the 130-150's but these are mostly first thing in the AM when she has not been resting.   Current HTN meds: amlodipine 10mg  daily, carvedilol 12.5mg  twice a day Previously tried: lisinopril (angioedema) BP goal: <130/80  Family History:  Family History  Problem Relation Age of Onset   COPD Mother    Arrhythmia Mother    Alzheimer's disease Mother    Osteoarthritis Mother    Cancer - Colon Father    Hyperlipidemia Sister      Social History: quit smoking 3-4 weeks ago  Diet: 1-2 cups of tea per day, 1 coke per day, cooking at home more, watching sodium  Exercise:  none  Home BP readings:  Date SBP/DBP  HR   126/82 60's   121/81    109/63    108/60   Average      Wt Readings from Last 3 Encounters:  09/07/22 150 lb 12.8 oz (68.4 kg)  02/23/21 154 lb 3.2 oz (69.9 kg)  09/08/20 153 lb (69.4 kg)   BP Readings from  Last 3 Encounters:  09/28/22 120/74  09/07/22 (!) 172/100  02/23/21 (!) 150/80   Pulse Readings from Last 3 Encounters:  09/28/22 63  09/07/22 93  02/23/21 70    Renal function: CrCl cannot be calculated (Patient's most recent lab result is older than the maximum 21 days allowed.).  Past Medical History:  Diagnosis Date   Anxiety    Arthritis    Bipolar disorder (H[facility])    CAD (coronary artery disease)    a. 12/2014 Inferior Inf STEMI/PCI: LM nl, LAD 60p, D1 50-60p, LCX nondom 66m, RI 80,m RCA dom, 100p (DES to prox and distal RCA), EF 60%;  b 12/2014 Staged attempted PCI of AV groove LCX with resultant dissection->aborted;  c. 12/2014 Echo: EF 50-55%, mod LVH, mild inf HK, Gr 1 DD, mild MR.   Daily headache    Depression    GERD (gastroesophageal reflux disease)    Hyperlipidemia    Hypertension    Tobacco abuse     Current Outpatient Medications on File Prior to Visit  Medication Sig Dispense Refill   amLODipine (NORVASC) 10 MG tablet Take 1 tablet (10 mg total) by mouth daily. 90 tablet 3   carvedilol (COREG) 12.5  MG tablet Take 1 tablet (12.5 mg total) by mouth 2 (two) times daily. 180 tablet 3   aspirin 81 MG tablet Take 1 tablet (81 mg total) by mouth daily.     atorvastatin (LIPITOR) 80 MG tablet Take 1 tablet (80 mg total) by mouth daily. 90 tablet 3   ezetimibe (ZETIA) 10 MG tablet Take 1 tablet (10 mg total) by mouth daily. 90 tablet 3   Melatonin 10 MG CAPS Take 10 mg by mouth at bedtime.     nitroGLYCERIN (NITROSTAT) 0.4 MG SL tablet Place 1 tablet (0.4 mg total) under the tongue every 5 (five) minutes as needed for chest pain. 25 tablet 3   ticagrelor (BRILINTA) 60 MG TABS tablet Take 1 tablet (60 mg total) by mouth 2 (two) times daily. CALL AND SCHEDULE FOLLOW UP OFFICE VISIT TO GET FURTHER REFILLS. [phone]. 60 tablet 0   No current facility-administered medications on file prior to visit.    Allergies  Allergen Reactions   Lisinopril Swelling   Penicillins  Hives   Sulfa Antibiotics Other (See Comments)    Parents both allergic and was told not to take sulfa drugs    Blood pressure 120/74, pulse 63.   Assessment/Plan:  1. Hypertension -  Essential hypertension Assessment: BP is controlled in office BP 120/ 74 mmHg at the goal (<130/80). Some AM readings are above goal, but she is not resting PM readings are mostly at goal, several in the low 100's systolic. HR in the 60's Tolerates medications well without any side effects  Denies dizziness, headaches,or swelling Congratulated her on quitting smoking Reiterated the importance of regular exercise and low salt diet   Plan:  Continue taking amlodipine 10mg  daily and carvedilol 12.5mg  daily Patient to keep record of BP readings with heart rate and report to at the next visit Bring in BP cuff to next visit to check accuracy.  Patient to see PharmD 1/23 prior to her apt with 2/23 that same day Call if blood pressure is consistently above goal Reminded to rest 5 min prior to checking Proper technique reviewed   Thank you  Lorin Picket, Pharm.D, BCPS, CPP [facility] A Division of [facility] [address] [place], [place]  Phone: [phone]; Fax: [phone]

## 2022-09-28 NOTE — Assessment & Plan Note (Signed)
Assessment: BP is controlled in office BP 120/ 74 mmHg at the goal (<130/80). Some AM readings are above goal, but she is not resting PM readings are mostly at goal, several in the low 100's systolic. HR in the 60's Tolerates medications well without any side effects  Denies dizziness, headaches,or swelling Congratulated her on quitting smoking Reiterated the importance of regular exercise and low salt diet   Plan:  Continue taking amlodipine 10mg  daily and carvedilol 12.5mg  daily Patient to keep record of BP readings with heart rate and report to at the next visit Bring in BP cuff to next visit to check accuracy.  Patient to see PharmD 1/23 prior to her apt with 2/23 that same day Call if blood pressure is consistently above goal Reminded to rest 5 min prior to checking Proper technique reviewed

## 2022-10-20 ENCOUNTER — Other Ambulatory Visit: Payer: Self-pay

## 2022-10-20 MED ORDER — EZETIMIBE 10 MG PO TABS: 10.0000 mg | ORAL_TABLET | Freq: Every day | ORAL | 3 refills | Status: DC

## 2022-10-21 ENCOUNTER — Other Ambulatory Visit: Payer: Self-pay

## 2022-10-21 DIAGNOSIS — I10 Essential (primary) hypertension: Secondary | ICD-10-CM

## 2022-10-21 MED ORDER — AMLODIPINE BESYLATE 10 MG PO TABS: 10.0000 mg | ORAL_TABLET | Freq: Every day | ORAL | 3 refills | Status: DC

## 2022-11-05 ENCOUNTER — Other Ambulatory Visit: Payer: Self-pay

## 2022-11-05 MED ORDER — ATORVASTATIN CALCIUM 80 MG PO TABS: 80.0000 mg | ORAL_TABLET | Freq: Every day | ORAL | 3 refills | Status: DC

## 2022-11-08 NOTE — Progress Notes (Deleted)
Cardiology Office Note:    Date:  11/08/2022   ID:  Sara Trujillo, DOB December 27, 1960, MRN [id number]  PCP:  Jonathon Resides, MD (Inactive)  [facility] Cardiologist:  Sherren Mocha, MD { Click to update primary MD,subspecialty MD or APP then REFRESH:1}  *** Referring MD: No ref. provider found   Chief Complaint:  No chief complaint on file. {Click here for Visit Info    :1}   Patient Profile: Coronary artery disease  Inferior STEMI 12/2014 s/p DES x 2 to dRCA and DES x 1 to p-mRCA Residual LCx 90 and RI 80 >>Staged PCI of LCx unsuccessful (dissection) pLAD 60 tx medically  Hypertension  Hyperlipidemia  GERD  Anxiety/depression  Bipolar d/o  ACE allergy (angioedema) +Cigs  Rheumatoid arthritis   Prior CV studies: VAS US CAROTID DUPLEX BILATERAL 05/03/2019 Narrative Summary: Bilateral extracranial vessels were near-normal with only minimal wall thickening or plaque. No plaque evident throughout the ICA, minimum wall thickening is noted in the proximal segment.  Echocardiogram 01/06/15 - Left ventricle: Wall thickness was increased in a pattern of    moderate LVH. Systolic function was normal. The estimated    ejection fraction was in the range of 50% to 55%. Probable mild    hypokinesis of the inferior myocardium. Doppler parameters are    consistent with abnormal left ventricular relaxation (grade 1    diastolic dysfunction).  - Mitral valve: There was mild regurgitation.   Cardiac catheterization 01/04/15 1. Left main; normal  2. LAD; 60% proximal, first diagonal branch was small Caliber and moderate in length and had 50-60% stenosis in its proximal third 3. Left circumflex; nondominant with a 90% mid AV groove, there was a ramus branch that had an 80% segmental mid stenosis.  4. Right coronary artery; dominant with occlusion at the Genu . This was the infarct related artery 5. Left ventriculography; RAO left ventriculogram was performed using   25 mL of Visipaque dye at 12 mL/second. The overall LVEF estimated  60 %  With wall motion abnormalities notable for a true apical severe hypokinesia PCI:  DES x 2 to dRCA and DES to prox-mid RCA     History of Present Illness:   Sara Trujillo is a 62 y.o. female with the above problem list.  She was last seen in clinic by Mayra Reel, NP 09/07/22. The pt had been out of all of her medications except Ticagrelor. Her BP was uncontrolled and her LDL was above target. She was started back on her meds and returns for f/u on coronary artery disease, hypertension, hyperlipidemia.***      {EKG done?:28833::"EKG: ***"}     Reviewed and updated this encounter:***     ROS  Labs/Other Test Reviewed:   Recent Labs: 09/07/2022: ALT 13; BUN 11; Creatinine, Ser 0.70; Potassium 4.0; Sodium 140   Recent Lipid Panel Recent Labs    09/07/22 0839  CHOL 268*  TRIG 206*  HDL 45  LDLCALC 184*     Risk Assessment/Calculations/Metrics:   {Does this patient have ATRIAL FIBRILLATION?:[phone]}     No BP recorded.  {Refresh Note OR Click here to enter BP  :1}***   Physical Exam:   VS:  There were no vitals taken for this visit.   Wt Readings from Last 3 Encounters:  09/07/22 150 lb 12.8 oz (68.4 kg)  02/23/21 154 lb 3.2 oz (69.9 kg)  09/08/20 153 lb (69.4 kg)    Physical Exam ***     ASSESSMENT & PLAN:  No problem-specific Assessment & Plan notes found for this encounter.  {   1. Coronary artery disease involving native coronary artery of native heart without angina pectoris Status post inferior STEMI in 3/16 treated with a total of 3 DES to the RCA.  She had attempted PCI to the residual disease in the LCx.  However, this was unsuccessful.  She has moderate nonobstructive disease in LAD.  She has been managed medically.  She is doing well without anginal symptoms.  Continue aspirin, ticagrelor, amlodipine, metoprolol, atorvastatin.  Follow-up in 6 months.   2. Essential  hypertension Blood pressure above target.  I have asked her to monitor blood pressure the next couple weeks and send those readings for review.  Goal blood pressure <130/80.  If blood pressure remains above goal, I will increase her amlodipine.   3. Pure hypercholesterolemia Continue high intensity statin therapy.  Obtain fasting lipids today.   4. Tobacco abuse We discussed the importance of quitting smoking.   :1}       {Are you ordering a CV Procedure (e.g. stress test, cath, DCCV, TEE, etc)?   Press F2        :[id number]   Dispo:  No follow-ups on file.   {Medication Adjustments/Labs and Tests Ordered: Current medicines are reviewed at length with the patient today.  Concerns regarding medicines are outlined above.  Tests Ordered: No orders of the defined types were placed in this encounter.  Medication Changes: No orders of the defined types were placed in this encounter.       :1}    Signed, Richardson Dopp, PA-C  11/08/2022 11:01 AM    [facility] [place], [place], [place]  [address] Phone: [phone]; Fax: [phone]

## 2022-11-09 ENCOUNTER — Ambulatory Visit: Payer: 59 | Admitting: Physician Assistant

## 2022-11-09 ENCOUNTER — Ambulatory Visit: Payer: 59 | Attending: Cardiovascular Disease | Admitting: Pharmacist

## 2022-11-09 DIAGNOSIS — E785 Hyperlipidemia, unspecified: Secondary | ICD-10-CM

## 2022-11-09 DIAGNOSIS — I10 Essential (primary) hypertension: Secondary | ICD-10-CM

## 2022-11-09 DIAGNOSIS — I2510 Atherosclerotic heart disease of native coronary artery without angina pectoris: Secondary | ICD-10-CM | POA: Diagnosis not present

## 2022-11-09 LAB — LIPID PANEL
Chol/HDL Ratio: 3.4 ratio (ref 0.0–4.4)
Cholesterol, Total: 131 mg/dL (ref 100–199)
HDL: 38 mg/dL — ABNORMAL LOW (ref >39)
LDL Chol Calc (NIH): 72 mg/dL (ref 0–99)
Triglycerides: 115 mg/dL (ref 0–149)
VLDL Cholesterol Cal: 21 mg/dL (ref 5–40)

## 2022-11-09 LAB — HEPATIC FUNCTION PANEL
ALT: 19 IU/L (ref 0–32)
AST: 15 IU/L (ref 0–40)
Albumin: 4.3 g/dL (ref 3.9–4.9)
Alkaline Phosphatase: 139 IU/L — ABNORMAL HIGH (ref 44–121)
Bilirubin Total: 0.6 mg/dL (ref 0.0–1.2)
Bilirubin, Direct: 0.16 mg/dL (ref 0.00–0.40)
Total Protein: 7 g/dL (ref 6.0–8.5)

## 2022-11-09 MED ORDER — CARVEDILOL 12.5 MG PO TABS: 12.5000 mg | ORAL_TABLET | Freq: Two times a day (BID) | ORAL | 3 refills | Status: DC

## 2022-11-09 MED ORDER — TICAGRELOR 60 MG PO TABS: 60.0000 mg | ORAL_TABLET | Freq: Two times a day (BID) | ORAL | 1 refills | Status: DC

## 2022-11-09 NOTE — Progress Notes (Signed)
Patient ID: Sara Trujillo                 DOB: Apr 14, 1961                      MRN: [id number]      HPI: Sara Trujillo is a 63 y.o. female patient of Dr. Burt Knack referred by Mayra Reel, NP to HTN clinic. PMH is significant for CAD s/p DES x 2 distal RCA and DES x 1 to proximal to mid RCA March 2016, hypertension, hyperlipidemia, bilateral carotid stenosis, tobacco abuse, GERD, angioedema reaction to ACEi, and RA.     Blood pressure in clinic 11/21 was 172/100. Patient had been out of her medication for 1 week. Amlodipine 5mg  daily and metoprolol XL 50mg  was restarted. Patient contacted the clinic about a week later reporting continued elevated blood pressures. Amlodipine was increased to 10mg  daily and metoprolol was changed to carvedilol 12.5mg  BID.  She was seen in the HTN clinic 09/28/22. BP was 120/74. She did have some home readings in the 782-956'O systolic first thing in the AM and prior to resting. No changes were made. Patient educated on proper technique and scheduled for follow up  to verify her BP cuff.  Patient presents today to clinic.  She denies any dizziness, lightheadedness ,headaches or swelling.  She reports being stressed at work she.  No exercise.  She has fallen off the wagon as far as smoking cessation is concerned.  However, she is only smoking a few cigarettes a day.  Not smoking nearly as much as prior.  She is still determined to quit completely and does not want any medication help.  She was supposed to see Richardson Dopp today however he had only planned to discuss blood pressure and have her cholesterol rechecked, therefore this appointment was canceled as patient did not have any concerns to discuss with him and her blood pressure was being addressed by the Pharm.D.  She brings in her blood pressure cuff today from home.  It is a [employer] cuff.  Technique is correct.  Home cuff reading was 125/89 and clinic cuff was 120/80.  She is cooking a lot at home  and watching her sodium intake.  She had questions about the max sodium intake.  Current HTN meds: amlodipine 10mg  daily, carvedilol 12.5mg  twice a day Previously tried: lisinopril (angioedema) BP goal: <130/80  Family History:  Family History  Problem Relation Age of Onset   COPD Mother    Arrhythmia Mother    Alzheimer's disease Mother    Osteoarthritis Mother    Cancer - Colon Father    Hyperlipidemia Sister     Social History: Smoking less than before, is determined to quit  Diet: 1-2 cups of tea per day, 1 coke per day, cooking at home more, watching sodium  Exercise:  none  Home BP readings:  Date SBP/DBP  HR   117/74 62   108/72    114/74    122/74   Average      Wt Readings from Last 3 Encounters:  09/07/22 150 lb 12.8 oz (68.4 kg)  02/23/21 154 lb 3.2 oz (69.9 kg)  09/08/20 153 lb (69.4 kg)   BP Readings from Last 3 Encounters:  09/28/22 120/74  09/07/22 (!) 172/100  02/23/21 (!) 150/80   Pulse Readings from Last 3 Encounters:  09/28/22 63  09/07/22 93  02/23/21 70    Renal function: CrCl cannot be calculated (Patient's most recent lab  result is older than the maximum 21 days allowed.).  Past Medical History:  Diagnosis Date   Anxiety    Arthritis    Bipolar disorder ([place])    CAD (coronary artery disease)    a. 12/2014 Inferior Inf STEMI/PCI: LM nl, LAD 60p, D1 50-60p, LCX nondom 23m, RI 80,m RCA dom, 100p (DES to prox and distal RCA), EF 60%;  b 12/2014 Staged attempted PCI of AV groove LCX with resultant dissection->aborted;  c. 12/2014 Echo: EF 50-55%, mod LVH, mild inf HK, Gr 1 DD, mild MR.   Daily headache    Depression    GERD (gastroesophageal reflux disease)    Hyperlipidemia    Hypertension    Tobacco abuse     Current Outpatient Medications on File Prior to Visit  Medication Sig Dispense Refill   amLODipine (NORVASC) 10 MG tablet Take 1 tablet (10 mg total) by mouth daily. 90 tablet 3   aspirin 81 MG tablet Take 1 tablet (81 mg  total) by mouth daily.     atorvastatin (LIPITOR) 80 MG tablet Take 1 tablet (80 mg total) by mouth daily. 90 tablet 3   ezetimibe (ZETIA) 10 MG tablet Take 1 tablet (10 mg total) by mouth daily. 90 tablet 3   Melatonin 10 MG CAPS Take 10 mg by mouth at bedtime.     nitroGLYCERIN (NITROSTAT) 0.4 MG SL tablet Place 1 tablet (0.4 mg total) under the tongue every 5 (five) minutes as needed for chest pain. 25 tablet 3   No current facility-administered medications on file prior to visit.    Allergies  Allergen Reactions   Lisinopril Swelling   Penicillins Hives   Sulfa Antibiotics Other (See Comments)    Parents both allergic and was told not to take sulfa drugs    There were no vitals taken for this visit.   Assessment/Plan:  1. Hypertension -  Essential hypertension Assessment: Blood pressure today in clinic on repeat is at goal of less than 130/80 Her home cuff seems to run a little high, but all of her home readings were at goal. I encouraged her to try to walk on her lunch break at work for some exercise and a stress reliever Advised DASH diet will give a sodium restriction of 1.5 g, okay to go to 2 g of sodium per day Monitor blood pressure at home a few times a week Continue amlodipine 10 mg daily and carvedilol 12.5 mg twice daily  Dyslipidemia Assessment: Last LDL-C was way above goal as patient was not taking her atorvastatin or ezetimibe Patient has since resumed after her November labs  Plan: Will check lipid panel and LFTs today LDL-C goal less than 55 due to premature ASCVD   Thank you  Ramond Dial, Pharm.D, BCPS, CPP [facility] A Division of [facility] [address] [place], [place] [address]  Phone: [phone]; Fax: [phone]

## 2022-11-09 NOTE — Patient Instructions (Addendum)
Summary of today's discussion  1. amlodipine 10mg  daily, carvedilol 12.5mg  twice a day  2.check blood pressure a few times a week  3.Try walking on breaks for exercise and stress relief  4.  5.   Your blood pressure goal is <130/80  To check your pressure at home you will need to:  1. Sit up in a chair, with feet flat on the floor and back supported. Do not cross your ankles or legs. 2. Rest your left arm so that the cuff is about heart level. If the cuff goes on your upper arm,  then just relax the arm on the table, arm of the chair or your lap. If you have a wrist cuff, we  suggest relaxing your wrist against your chest (think of it as Pledging the Flag with the  wrong arm).  3. Place the cuff snugly around your arm, about 1 inch above the crook of your elbow. The  cords should be inside the groove of your elbow.  4. Sit quietly, with the cuff in place, for about 5 minutes. After that 5 minutes press the power  button to start a reading. 5. Do not talk or move while the reading is taking place.  6. Record your readings on a sheet of paper. Although most cuffs have a memory, it is often  easier to see a pattern developing when the numbers are all in front of you.  7. You can repeat the reading after 1-3 minutes if it is recommended  Make sure your bladder is empty and you have not had caffeine or tobacco within the last 30 min  Always bring your blood pressure log with you to your appointments. If you have not brought your monitor in to be double checked for accuracy, please bring it to your next appointment.  You can find a list of validated (accurate) blood pressure cuffs at [employer]   Important lifestyle changes to control high blood pressure  Intervention  Effect on the BP  Lose extra pounds and watch your waistline Weight loss is one of the most effective lifestyle changes for controlling blood pressure. If you're overweight or obese, losing even a small amount of  weight can help reduce blood pressure. Blood pressure might go down by about 1 millimeter of mercury (mm Hg) with each kilogram (about 2.2 pounds) of weight lost.  Exercise regularly As a general goal, aim for at least 30 minutes of moderate physical activity every day. Regular physical activity can lower high blood pressure by about 5 to 8 mm Hg.  Eat a healthy diet Eating a diet rich in whole grains, fruits, vegetables, and low-fat dairy products and low in saturated fat and cholesterol. A healthy diet can lower high blood pressure by up to 11 mm Hg.  Reduce salt (sodium) in your diet Even a small reduction of sodium in the diet can improve heart health and reduce high blood pressure by about 5 to 6 mm Hg.  Limit alcohol One drink equals 12 ounces of beer, 5 ounces of wine, or 1.5 ounces of 80-proof liquor.  Limiting alcohol to less than one drink a day for women or two drinks a day for men can help lower blood pressure by about 4 mm Hg.   Please call me at [phone] with any questions.

## 2022-11-09 NOTE — Assessment & Plan Note (Signed)
Assessment: Last LDL-C was way above goal as patient was not taking her atorvastatin or ezetimibe Patient has since resumed after her November labs  Plan: Will check lipid panel and LFTs today LDL-C goal less than 55 due to premature ASCVD

## 2022-11-09 NOTE — Assessment & Plan Note (Signed)
Assessment: Blood pressure today in clinic on repeat is at goal of less than 130/80 Her home cuff seems to run a little high, but all of her home readings were at goal. I encouraged her to try to walk on her lunch break at work for some exercise and a stress reliever Advised DASH diet will give a sodium restriction of 1.5 g, okay to go to 2 g of sodium per day Monitor blood pressure at home a few times a week Continue amlodipine 10 mg daily and carvedilol 12.5 mg twice daily

## 2022-11-10 ENCOUNTER — Encounter: Payer: Self-pay | Admitting: Pharmacist

## 2022-11-22 MED ORDER — REPATHA SURECLICK 140 MG/ML ~~LOC~~ SOAJ: 1.0000 mL | SUBCUTANEOUS | 11 refills | Status: DC

## 2023-01-28 ENCOUNTER — Encounter: Payer: Self-pay | Admitting: Pharmacist

## 2023-07-20 ENCOUNTER — Other Ambulatory Visit: Payer: Self-pay

## 2023-07-20 MED ORDER — TICAGRELOR 60 MG PO TABS: 60.0000 mg | ORAL_TABLET | Freq: Two times a day (BID) | ORAL | 0 refills | Status: DC

## 2023-09-28 ENCOUNTER — Other Ambulatory Visit: Payer: Self-pay | Admitting: Cardiovascular Disease

## 2023-09-28 DIAGNOSIS — I10 Essential (primary) hypertension: Secondary | ICD-10-CM

## 2023-10-14 ENCOUNTER — Other Ambulatory Visit: Payer: Self-pay | Admitting: Cardiovascular Disease

## 2023-10-14 DIAGNOSIS — I10 Essential (primary) hypertension: Secondary | ICD-10-CM

## 2023-10-18 ENCOUNTER — Other Ambulatory Visit: Payer: Self-pay | Admitting: Cardiovascular Disease

## 2023-10-31 ENCOUNTER — Other Ambulatory Visit: Payer: Self-pay | Admitting: Cardiovascular Disease

## 2023-12-06 ENCOUNTER — Telehealth: Payer: Self-pay | Admitting: Physician Assistant

## 2023-12-06 DIAGNOSIS — I10 Essential (primary) hypertension: Secondary | ICD-10-CM

## 2023-12-06 MED ORDER — CARVEDILOL 12.5 MG PO TABS: 12.5000 mg | ORAL_TABLET | Freq: Two times a day (BID) | ORAL | 1 refills | Status: DC

## 2023-12-06 MED ORDER — ATORVASTATIN CALCIUM 80 MG PO TABS: 80.0000 mg | ORAL_TABLET | Freq: Every day | ORAL | 1 refills | Status: DC

## 2023-12-06 MED ORDER — EZETIMIBE 10 MG PO TABS: 10.0000 mg | ORAL_TABLET | Freq: Every day | ORAL | 1 refills | Status: DC

## 2023-12-06 MED ORDER — AMLODIPINE BESYLATE 10 MG PO TABS: 10.0000 mg | ORAL_TABLET | Freq: Every day | ORAL | 1 refills | Status: DC

## 2023-12-06 MED ORDER — TICAGRELOR 60 MG PO TABS: 60.0000 mg | ORAL_TABLET | Freq: Two times a day (BID) | ORAL | 1 refills | Status: DC

## 2023-12-06 NOTE — Telephone Encounter (Signed)
 Pt's medications were sent to pt's pharmacy as requested. Confirmation received.

## 2023-12-06 NOTE — Telephone Encounter (Signed)
*  STAT* If patient is at the pharmacy, call can be transferred to refill team.   1. Which medications need to be refilled? (please list name of each medication and dose if known)  ticagrelor (BRILINTA) 60 MG TABS tablet carvedilol (COREG) 12.5 MG tablet amLODipine (NORVASC) 10 MG tablet atorvastatin (LIPITOR) 80 MG tablet ezetimibe (ZETIA) 10 MG tablet  2. Which pharmacy/location (including street and city if local pharmacy) is medication to be sent to? [id number] - [place], [place] - [address]  3. Do they need a 30 day or 90 day supply?   90 day supply

## 2024-01-10 ENCOUNTER — Encounter: Payer: Self-pay | Admitting: Physician Assistant

## 2024-01-10 ENCOUNTER — Ambulatory Visit: Payer: 59 | Attending: Physician Assistant | Admitting: Physician Assistant

## 2024-01-10 ENCOUNTER — Other Ambulatory Visit: Payer: Self-pay | Admitting: *Deleted

## 2024-01-10 VITALS — BP 140/80 | HR 70 | Ht 67.0 in | Wt 155.0 lb

## 2024-01-10 DIAGNOSIS — Z9861 Coronary angioplasty status: Secondary | ICD-10-CM

## 2024-01-10 DIAGNOSIS — E7800 Pure hypercholesterolemia, unspecified: Secondary | ICD-10-CM | POA: Insufficient documentation

## 2024-01-10 DIAGNOSIS — I10 Essential (primary) hypertension: Secondary | ICD-10-CM

## 2024-01-10 DIAGNOSIS — I2510 Atherosclerotic heart disease of native coronary artery without angina pectoris: Secondary | ICD-10-CM

## 2024-01-10 DIAGNOSIS — Z720 Tobacco use: Secondary | ICD-10-CM | POA: Diagnosis not present

## 2024-01-10 DIAGNOSIS — I2119 ST elevation (STEMI) myocardial infarction involving other coronary artery of inferior wall: Secondary | ICD-10-CM

## 2024-01-10 DIAGNOSIS — E785 Hyperlipidemia, unspecified: Secondary | ICD-10-CM

## 2024-01-10 MED ORDER — ATORVASTATIN CALCIUM 80 MG PO TABS
80.0000 mg | ORAL_TABLET | Freq: Every day | ORAL | 3 refills | Status: AC
Start: 2024-01-10 — End: ?

## 2024-01-10 MED ORDER — NITROGLYCERIN 0.4 MG SL SUBL: 0.4000 mg | SUBLINGUAL_TABLET | SUBLINGUAL | 3 refills | Status: AC | PRN

## 2024-01-10 MED ORDER — EZETIMIBE 10 MG PO TABS
10.0000 mg | ORAL_TABLET | Freq: Every day | ORAL | 3 refills | Status: AC
Start: 2024-01-10 — End: ?

## 2024-01-10 MED ORDER — CARVEDILOL 12.5 MG PO TABS: 18.7500 mg | ORAL_TABLET | Freq: Two times a day (BID) | ORAL | 3 refills | Status: AC

## 2024-01-10 MED ORDER — REPATHA 140 MG/ML ~~LOC~~ SOSY: 140.0000 mg | PREFILLED_SYRINGE | SUBCUTANEOUS | 11 refills | Status: AC

## 2024-01-10 MED ORDER — TICAGRELOR 60 MG PO TABS: 60.0000 mg | ORAL_TABLET | Freq: Two times a day (BID) | ORAL | 3 refills | Status: AC

## 2024-01-10 NOTE — Patient Instructions (Signed)
 Medication Instructions:  Your physician has recommended you make the following change in your medication:   INCREASE the Carvedilol to 12.5 taking 1 and 1/2 twice a day START the Repatha Injection.  It's every 2 weeks  *If you need a refill on your cardiac medications before your next appointment, please call your pharmacy*   Lab Work: IN END OF JUNE, 1ST OF JULY, GO TO A [facility] NEAR YOU, FASTING, FOR:  CMET, LIPID, & CBC   [facility] locations:   [facility] - [address] ([address] office) - [address] ([facility]) - [address] [phone]   [place] [facility] [address]    [place]  - [address]    [place] - [address] - [address] [employer]  - [address] [place] - [address] [employer] Officer)  If you have labs (blood work) drawn today and your tests are completely normal, you will receive your results only by: [facility] (if you have MyChart) OR A paper copy in the mail If you have any lab test that is abnormal or we need to change your treatment, we will call you to review the results.   Testing/Procedures: None ordered   Follow-Up: At [facility], you and your health needs are our priority.  As part of our continuing mission to provide you with exceptional heart care, we have created designated Provider Care Teams.  These Care Teams include your primary Cardiologist (physician) and Advanced Practice Providers (APPs -  Physician Assistants and Nurse Practitioners) who all work together to provide you with the care you need, when you need it.  We recommend signing up for the patient portal called "MyChart".  Sign up information is provided on this After Visit Summary.  MyChart is used to connect with patients for Virtual Visits (Telemedicine).  Patients are able to view lab/test results,  encounter notes, upcoming appointments, etc.  Non-urgent messages can be sent to your provider as well.   To learn more about what you can do with MyChart, go to [email or web address].    Your next appointment:   12 month(s)  Provider:   Tereso Newcomer, PA-C         Other Instructions     1st Floor: - Lobby - Registration  - Pharmacy  - Lab - Cafe  2nd Floor: - PV Lab - Diagnostic Testing (echo, CT, nuclear med)  3rd Floor: - Vacant  4th Floor: - TCTS (cardiothoracic surgery) - AFib Clinic - Structural Heart Clinic - Vascular Surgery  - Vascular Ultrasound  5th Floor: - [facility] Cardiology (general and EP) - Clinical Pharmacy for coumadin, hypertension, lipid, weight-loss medications, and med management appointments    Valet parking services will be available as well.

## 2024-01-10 NOTE — Progress Notes (Signed)
 Cardiology Office Note:    Date:  01/10/2024  ID:  Sara Trujillo, DOB 01-Mar-1961, MRN [id number] PCP: Gillian Scarce, MD  [facility] Providers Cardiologist:  Tonny Bollman, MD       Patient Profile:      Coronary artery disease  Inferior STEMI 12/2014 s/p DES x 2 to dRCA and DES x 1 to p-mRCA Residual LCx 90 and RI 80 >>Staged PCI of LCx unsuccessful (dissection) pLAD 60 tx medically  TTE 01/06/2015: Moderate LVH, EF 50-55, inferior HK, GR 1 DD, mild MR Hypertension  Hyperlipidemia  Carotid US 05/03/2019: Bilateral ICA without plaque GERD  Anxiety/depression  Bipolar d/o  ACE allergy (angioedema) Tobacco use Rheumatoid arthritis          Discussed the use of AI scribe software for clinical note transcription with the patient, who gave verbal consent to proceed.  History of Present Illness Sara Trujillo is a 63 y.o. female who returns for follow-up of CAD.  She was last seen in clinic 09/07/2022 by Carlos Levering, NP.  She did have follow-up in hypertension clinic and was last seen 11/09/2022.  She is here alone.  She is currently asymptomatic with no chest discomfort or angina. She is on dual antiplatelet therapy with Brilinta 60 mg twice daily and aspirin 81 mg daily, which she tolerates well without bleeding issues. She has an allergy to lisinopril, which caused angioedema. She does not frequently check her blood pressure at home.  She has not shortness of breath, dizziness, or leg swelling.   Review of Systems  Hematologic/Lymphatic: Does not bruise/bleed easily.  Gastrointestinal:  Negative for hematochezia and melena.  Genitourinary:  Negative for hematuria.  -See HPI    Studies Reviewed:   EKG Interpretation Date/Time:  Tuesday January 10 2024 15:57:25 EDT Ventricular Rate:  69 PR Interval:  164 QRS Duration:  86 QT Interval:  406 QTC Calculation: 435 R Axis:   64  Text Interpretation: Normal sinus rhythm Inferior Q waves noted No  significant change since last tracing Confirmed by Tereso Newcomer [phone]) on 01/10/2024 4:00:56 PM   Results LABS LDL: 72 (10/2022)    Risk Assessment/Calculations:     HYPERTENSION CONTROL Vitals:   01/10/24 1505 01/10/24 1553  BP: (!) 140/60 (!) 140/80    The patient's blood pressure is elevated above target today.  In order to address the patient's elevated BP: A current anti-hypertensive medication was adjusted today.          Physical Exam:   VS:  BP (!) 140/80   Pulse 70   Ht 5\' 7"  (1.702 m)   Wt 155 lb (70.3 kg)   SpO2 96%   BMI 24.28 kg/m    Wt Readings from Last 3 Encounters:  01/10/24 155 lb (70.3 kg)  09/07/22 150 lb 12.8 oz (68.4 kg)  02/23/21 154 lb 3.2 oz (69.9 kg)    Constitutional:      Appearance: Healthy appearance. Not in distress.  Neck:     Vascular: No carotid bruit. JVD normal.  Pulmonary:     Breath sounds: Normal breath sounds. No wheezing. No rales.  Cardiovascular:     Normal rate. Regular rhythm.     Murmurs: There is no murmur.  Edema:    Peripheral edema absent.  Abdominal:     Palpations: Abdomen is soft.        Assessment and Plan:   Assessment & Plan Coronary artery disease involving native coronary artery of native heart without angina pectoris  Status post inferior STEMI in 3/16 treated with a total of 3 DES to the RCA.  She had attempted PCI to the residual disease in the LCx.  However, this was unsuccessful.  She has moderate nonobstructive disease in LAD.  She has been managed medically. Echocardiogram in March 2016 showed EF at 55% and Inf hypokinesis, mild diastolic dysfunction. Currently asymptomatic without angina. We discussed eliminating ASA. She prefers to continue dual antiplatelet therapy with aspirin and Brilinta due to good tolerance and no bleeding issues. - Continue aspirin 81 mg daily - Continue Lipitor 80 mg daily - Continue nitroglycerin as needed - Continue Brilinta 60 mg twice daily - Follow up in one  year - Arrange follow-up CMET, CBC Pure hypercholesterolemia LDL in January 2024 was 72, above the goal of less than 55.  She was initially hesitant to start Repatha due to concerns about side effects.  We discussed this today.  She is now willing to try it.  She has currently on Lipitor 80 mg daily and Zetia 10 mg daily. - Continue Lipitor 80 mg daily - Continue Zetia 10 mg daily - Restart Repatha 140 mg every 14 days - Arrange fasting CMET, lipids in three months Essential hypertension Blood pressure is uncontrolled. Currently on amlodipine 10 mg daily and carvedilol 12.5 mg twice daily.  She has an allergy to ACE inhibitors (angioedema). - Increase carvedilol to 18.75 mg twice daily - Continue amlodipine 10 mg daily Tobacco abuse Discussed the importance of smoking cessation. - Encourage smoking cessation         Dispo:  Return in about 1 year (around 01/09/2025) for Routine Follow Up, w/ Tereso Newcomer, PA-C.  Signed, Tereso Newcomer, PA-C

## 2024-01-10 NOTE — Assessment & Plan Note (Signed)
 Status post inferior STEMI in 3/16 treated with a total of 3 DES to the RCA.  She had attempted PCI to the residual disease in the LCx.  However, this was unsuccessful.  She has moderate nonobstructive disease in LAD.  She has been managed medically. Echocardiogram in March 2016 showed EF at 55% and Inf hypokinesis, mild diastolic dysfunction. Currently asymptomatic without angina. We discussed eliminating ASA. She prefers to continue dual antiplatelet therapy with aspirin and Brilinta due to good tolerance and no bleeding issues. - Continue aspirin 81 mg daily - Continue Lipitor 80 mg daily - Continue nitroglycerin as needed - Continue Brilinta 60 mg twice daily - Follow up in one year - Arrange follow-up CMET, CBC

## 2024-01-10 NOTE — Assessment & Plan Note (Signed)
 Discussed the importance of smoking cessation. - Encourage smoking cessation

## 2024-01-10 NOTE — Assessment & Plan Note (Signed)
 LDL in January 2024 was 72, above the goal of less than 55.  She was initially hesitant to start Repatha due to concerns about side effects.  We discussed this today.  She is now willing to try it.  She has currently on Lipitor 80 mg daily and Zetia 10 mg daily. - Continue Lipitor 80 mg daily - Continue Zetia 10 mg daily - Restart Repatha 140 mg every 14 days - Arrange fasting CMET, lipids in three months

## 2024-01-10 NOTE — Addendum Note (Signed)
 Addended by: Burnetta Sabin on: 01/10/2024 04:05 PM   Modules accepted: Orders

## 2024-01-10 NOTE — Assessment & Plan Note (Signed)
 Blood pressure is uncontrolled. Currently on amlodipine 10 mg daily and carvedilol 12.5 mg twice daily.  She has an allergy to ACE inhibitors (angioedema). - Increase carvedilol to 18.75 mg twice daily - Continue amlodipine 10 mg daily

## 2024-03-26 ENCOUNTER — Other Ambulatory Visit: Payer: Self-pay | Admitting: Cardiovascular Disease

## 2024-03-26 DIAGNOSIS — I10 Essential (primary) hypertension: Secondary | ICD-10-CM
